# Patient Record
Sex: Female | Born: 1935 | Race: White | Hispanic: No | Marital: Single | Smoking: Former smoker
Health system: Southern US, Community
[De-identification: ages and names within clinical notes are randomized; demographics above are authoritative.]

## PROBLEM LIST (undated history)

## (undated) DIAGNOSIS — R159 Full incontinence of feces: Secondary | ICD-10-CM

## (undated) DIAGNOSIS — K219 Gastro-esophageal reflux disease without esophagitis: Secondary | ICD-10-CM

## (undated) DIAGNOSIS — N3941 Urge incontinence: Secondary | ICD-10-CM

## (undated) DIAGNOSIS — M1990 Unspecified osteoarthritis, unspecified site: Secondary | ICD-10-CM

## (undated) DIAGNOSIS — G629 Polyneuropathy, unspecified: Secondary | ICD-10-CM

## (undated) DIAGNOSIS — R42 Dizziness and giddiness: Secondary | ICD-10-CM

## (undated) HISTORY — DX: Polyneuropathy, unspecified: G62.9

## (undated) HISTORY — PX: TONSILLECTOMY: SUR1361

## (undated) HISTORY — DX: Full incontinence of feces: R15.9

## (undated) HISTORY — DX: Gastro-esophageal reflux disease without esophagitis: K21.9

## (undated) HISTORY — PX: BREAST LUMPECTOMY: SHX2

## (undated) HISTORY — DX: Unspecified osteoarthritis, unspecified site: M19.90

## (undated) HISTORY — DX: Urge incontinence: N39.41

## (undated) HISTORY — PX: FACIAL COSMETIC SURGERY: SHX629

## (undated) HISTORY — PX: NASAL SEPTUM SURGERY: SHX37

## (undated) HISTORY — DX: Dizziness and giddiness: R42

---

## 2014-01-18 HISTORY — PX: CATARACT EXTRACTION, BILATERAL: SHX1313

## 2015-01-19 HISTORY — PX: OTHER SURGICAL HISTORY: SHX169

## 2015-02-17 DIAGNOSIS — M810 Age-related osteoporosis without current pathological fracture: Secondary | ICD-10-CM | POA: Diagnosis not present

## 2015-02-17 DIAGNOSIS — F329 Major depressive disorder, single episode, unspecified: Secondary | ICD-10-CM | POA: Diagnosis not present

## 2015-03-06 DIAGNOSIS — Z1231 Encounter for screening mammogram for malignant neoplasm of breast: Secondary | ICD-10-CM | POA: Diagnosis not present

## 2015-03-18 DIAGNOSIS — K219 Gastro-esophageal reflux disease without esophagitis: Secondary | ICD-10-CM | POA: Diagnosis not present

## 2015-03-18 DIAGNOSIS — N39498 Other specified urinary incontinence: Secondary | ICD-10-CM | POA: Diagnosis not present

## 2015-03-18 DIAGNOSIS — M5417 Radiculopathy, lumbosacral region: Secondary | ICD-10-CM | POA: Diagnosis not present

## 2015-03-18 DIAGNOSIS — F329 Major depressive disorder, single episode, unspecified: Secondary | ICD-10-CM | POA: Diagnosis not present

## 2015-03-20 DIAGNOSIS — R2689 Other abnormalities of gait and mobility: Secondary | ICD-10-CM | POA: Diagnosis not present

## 2015-03-25 DIAGNOSIS — R2689 Other abnormalities of gait and mobility: Secondary | ICD-10-CM | POA: Diagnosis not present

## 2015-04-01 DIAGNOSIS — R2689 Other abnormalities of gait and mobility: Secondary | ICD-10-CM | POA: Diagnosis not present

## 2015-04-04 DIAGNOSIS — E7800 Pure hypercholesterolemia, unspecified: Secondary | ICD-10-CM | POA: Diagnosis not present

## 2015-04-04 DIAGNOSIS — D649 Anemia, unspecified: Secondary | ICD-10-CM | POA: Diagnosis not present

## 2015-04-04 DIAGNOSIS — F329 Major depressive disorder, single episode, unspecified: Secondary | ICD-10-CM | POA: Diagnosis not present

## 2015-04-04 DIAGNOSIS — M810 Age-related osteoporosis without current pathological fracture: Secondary | ICD-10-CM | POA: Diagnosis not present

## 2015-04-08 DIAGNOSIS — F329 Major depressive disorder, single episode, unspecified: Secondary | ICD-10-CM | POA: Diagnosis not present

## 2015-04-08 DIAGNOSIS — G64 Other disorders of peripheral nervous system: Secondary | ICD-10-CM | POA: Diagnosis not present

## 2015-04-08 DIAGNOSIS — N39498 Other specified urinary incontinence: Secondary | ICD-10-CM | POA: Diagnosis not present

## 2015-04-08 DIAGNOSIS — M5417 Radiculopathy, lumbosacral region: Secondary | ICD-10-CM | POA: Diagnosis not present

## 2015-04-09 DIAGNOSIS — R2689 Other abnormalities of gait and mobility: Secondary | ICD-10-CM | POA: Diagnosis not present

## 2015-04-14 DIAGNOSIS — R2689 Other abnormalities of gait and mobility: Secondary | ICD-10-CM | POA: Diagnosis not present

## 2015-04-23 DIAGNOSIS — R9431 Abnormal electrocardiogram [ECG] [EKG]: Secondary | ICD-10-CM | POA: Diagnosis not present

## 2015-04-23 DIAGNOSIS — W19XXXA Unspecified fall, initial encounter: Secondary | ICD-10-CM | POA: Diagnosis not present

## 2015-04-23 DIAGNOSIS — R079 Chest pain, unspecified: Secondary | ICD-10-CM | POA: Diagnosis not present

## 2015-04-23 DIAGNOSIS — R531 Weakness: Secondary | ICD-10-CM | POA: Diagnosis not present

## 2015-04-23 DIAGNOSIS — R51 Headache: Secondary | ICD-10-CM | POA: Diagnosis not present

## 2015-04-23 DIAGNOSIS — G319 Degenerative disease of nervous system, unspecified: Secondary | ICD-10-CM | POA: Diagnosis not present

## 2015-04-23 DIAGNOSIS — Z9181 History of falling: Secondary | ICD-10-CM | POA: Diagnosis not present

## 2015-04-25 DIAGNOSIS — R296 Repeated falls: Secondary | ICD-10-CM | POA: Diagnosis not present

## 2015-04-25 DIAGNOSIS — R269 Unspecified abnormalities of gait and mobility: Secondary | ICD-10-CM | POA: Diagnosis not present

## 2015-04-25 DIAGNOSIS — F329 Major depressive disorder, single episode, unspecified: Secondary | ICD-10-CM | POA: Diagnosis not present

## 2015-04-30 DIAGNOSIS — R269 Unspecified abnormalities of gait and mobility: Secondary | ICD-10-CM | POA: Diagnosis not present

## 2015-04-30 DIAGNOSIS — Z853 Personal history of malignant neoplasm of breast: Secondary | ICD-10-CM | POA: Diagnosis not present

## 2015-04-30 DIAGNOSIS — M5116 Intervertebral disc disorders with radiculopathy, lumbar region: Secondary | ICD-10-CM | POA: Diagnosis not present

## 2015-04-30 DIAGNOSIS — M4726 Other spondylosis with radiculopathy, lumbar region: Secondary | ICD-10-CM | POA: Diagnosis not present

## 2015-04-30 DIAGNOSIS — M50223 Other cervical disc displacement at C6-C7 level: Secondary | ICD-10-CM | POA: Diagnosis not present

## 2015-04-30 DIAGNOSIS — M50322 Other cervical disc degeneration at C5-C6 level: Secondary | ICD-10-CM | POA: Diagnosis not present

## 2015-04-30 DIAGNOSIS — M47812 Spondylosis without myelopathy or radiculopathy, cervical region: Secondary | ICD-10-CM | POA: Diagnosis not present

## 2015-04-30 DIAGNOSIS — M4806 Spinal stenosis, lumbar region: Secondary | ICD-10-CM | POA: Diagnosis not present

## 2015-04-30 DIAGNOSIS — M9971 Connective tissue and disc stenosis of intervertebral foramina of cervical region: Secondary | ICD-10-CM | POA: Diagnosis not present

## 2015-04-30 DIAGNOSIS — R9082 White matter disease, unspecified: Secondary | ICD-10-CM | POA: Diagnosis not present

## 2015-04-30 DIAGNOSIS — G319 Degenerative disease of nervous system, unspecified: Secondary | ICD-10-CM | POA: Diagnosis not present

## 2015-04-30 DIAGNOSIS — M4319 Spondylolisthesis, multiple sites in spine: Secondary | ICD-10-CM | POA: Diagnosis not present

## 2015-05-13 DIAGNOSIS — R05 Cough: Secondary | ICD-10-CM | POA: Diagnosis not present

## 2015-06-13 DIAGNOSIS — H31113 Age-related choroidal atrophy, bilateral: Secondary | ICD-10-CM | POA: Diagnosis not present

## 2015-06-13 DIAGNOSIS — F321 Major depressive disorder, single episode, moderate: Secondary | ICD-10-CM | POA: Diagnosis not present

## 2015-06-20 DIAGNOSIS — F321 Major depressive disorder, single episode, moderate: Secondary | ICD-10-CM | POA: Diagnosis not present

## 2015-06-24 DIAGNOSIS — F321 Major depressive disorder, single episode, moderate: Secondary | ICD-10-CM | POA: Diagnosis not present

## 2015-07-01 DIAGNOSIS — F321 Major depressive disorder, single episode, moderate: Secondary | ICD-10-CM | POA: Diagnosis not present

## 2015-07-08 DIAGNOSIS — F321 Major depressive disorder, single episode, moderate: Secondary | ICD-10-CM | POA: Diagnosis not present

## 2015-07-15 DIAGNOSIS — F321 Major depressive disorder, single episode, moderate: Secondary | ICD-10-CM | POA: Diagnosis not present

## 2015-07-25 DIAGNOSIS — F321 Major depressive disorder, single episode, moderate: Secondary | ICD-10-CM | POA: Diagnosis not present

## 2015-07-29 DIAGNOSIS — L820 Inflamed seborrheic keratosis: Secondary | ICD-10-CM | POA: Diagnosis not present

## 2015-07-29 DIAGNOSIS — F321 Major depressive disorder, single episode, moderate: Secondary | ICD-10-CM | POA: Diagnosis not present

## 2015-08-06 DIAGNOSIS — F321 Major depressive disorder, single episode, moderate: Secondary | ICD-10-CM | POA: Diagnosis not present

## 2015-08-12 DIAGNOSIS — F321 Major depressive disorder, single episode, moderate: Secondary | ICD-10-CM | POA: Diagnosis not present

## 2015-08-19 DIAGNOSIS — F321 Major depressive disorder, single episode, moderate: Secondary | ICD-10-CM | POA: Diagnosis not present

## 2015-08-26 DIAGNOSIS — F321 Major depressive disorder, single episode, moderate: Secondary | ICD-10-CM | POA: Diagnosis not present

## 2015-08-28 DIAGNOSIS — H26493 Other secondary cataract, bilateral: Secondary | ICD-10-CM | POA: Diagnosis not present

## 2015-08-28 DIAGNOSIS — H1132 Conjunctival hemorrhage, left eye: Secondary | ICD-10-CM | POA: Diagnosis not present

## 2015-09-02 DIAGNOSIS — F321 Major depressive disorder, single episode, moderate: Secondary | ICD-10-CM | POA: Diagnosis not present

## 2015-09-09 DIAGNOSIS — F321 Major depressive disorder, single episode, moderate: Secondary | ICD-10-CM | POA: Diagnosis not present

## 2015-09-16 DIAGNOSIS — F321 Major depressive disorder, single episode, moderate: Secondary | ICD-10-CM | POA: Diagnosis not present

## 2015-09-24 DIAGNOSIS — F321 Major depressive disorder, single episode, moderate: Secondary | ICD-10-CM | POA: Diagnosis not present

## 2015-10-01 DIAGNOSIS — F321 Major depressive disorder, single episode, moderate: Secondary | ICD-10-CM | POA: Diagnosis not present

## 2015-10-06 DIAGNOSIS — F321 Major depressive disorder, single episode, moderate: Secondary | ICD-10-CM | POA: Diagnosis not present

## 2015-10-06 DIAGNOSIS — Z23 Encounter for immunization: Secondary | ICD-10-CM | POA: Diagnosis not present

## 2015-10-15 DIAGNOSIS — F321 Major depressive disorder, single episode, moderate: Secondary | ICD-10-CM | POA: Diagnosis not present

## 2015-10-22 DIAGNOSIS — F321 Major depressive disorder, single episode, moderate: Secondary | ICD-10-CM | POA: Diagnosis not present

## 2015-10-29 DIAGNOSIS — F321 Major depressive disorder, single episode, moderate: Secondary | ICD-10-CM | POA: Diagnosis not present

## 2015-11-06 DIAGNOSIS — F321 Major depressive disorder, single episode, moderate: Secondary | ICD-10-CM | POA: Diagnosis not present

## 2015-11-19 DIAGNOSIS — F321 Major depressive disorder, single episode, moderate: Secondary | ICD-10-CM | POA: Diagnosis not present

## 2015-11-26 DIAGNOSIS — F321 Major depressive disorder, single episode, moderate: Secondary | ICD-10-CM | POA: Diagnosis not present

## 2015-12-02 DIAGNOSIS — H26491 Other secondary cataract, right eye: Secondary | ICD-10-CM | POA: Diagnosis not present

## 2015-12-03 DIAGNOSIS — F321 Major depressive disorder, single episode, moderate: Secondary | ICD-10-CM | POA: Diagnosis not present

## 2015-12-19 DIAGNOSIS — F321 Major depressive disorder, single episode, moderate: Secondary | ICD-10-CM | POA: Diagnosis not present

## 2015-12-24 DIAGNOSIS — H26491 Other secondary cataract, right eye: Secondary | ICD-10-CM | POA: Diagnosis not present

## 2016-01-20 ENCOUNTER — Ambulatory Visit (INDEPENDENT_AMBULATORY_CARE_PROVIDER_SITE_OTHER): Payer: Medicare Other | Admitting: Family Medicine

## 2016-01-20 VITALS — BP 167/75 | HR 85 | Temp 97.9°F | Resp 18 | Ht 66.0 in | Wt 152.0 lb

## 2016-01-20 DIAGNOSIS — T162XXA Foreign body in left ear, initial encounter: Secondary | ICD-10-CM | POA: Diagnosis not present

## 2016-01-20 DIAGNOSIS — H9202 Otalgia, left ear: Secondary | ICD-10-CM

## 2016-01-20 NOTE — Progress Notes (Signed)
Patient ID: Jodi Miller, female    DOB: Aug 10, 1934  Age: 82 y.o. MRN: [id number]  Chief Complaint  Patient presents with  . Foreign Body in [place]    believes her hearing aid is stuck in her left ear    Subjective:   82 year old lady who wears hearing aids. A portion of the left hearing aid came off in the ear canal a couple of days ago and will come out. It is causing a little discomfort.  Current allergies, medications, problem list, past/family and social histories reviewed.  Objective:  BP (!) 167/75   Pulse 85   Temp 97.9 F (36.6 C) (Oral)   Resp 18   Ht 5\' 6"  (1.676 m)   Wt 152 lb (68.9 kg)   SpO2 96%   BMI 24.53 kg/m   Hearing aids tip is present in the left ear canal  Procedure: Using alligator forceps under direct visualization the foreign object was grasped and extracted without any major difficulty. Patient tolerated well. She will discuss this further with her hearing aid doctor.  Assessment & Plan:   Assessment: 1. Foreign body of left ear, initial encounter   2. Otalgia of left ear       Plan: Removed foreign body  No orders of the defined types were placed in this encounter.    Meds ordered this encounter  Medications  . gabapentin (NEURONTIN) 300 MG capsule    Sig: Take 300 mg by mouth 3 (three) times daily.  Marland Kitchen venlafaxine (EFFEXOR) 37.5 MG tablet    Sig: Take 37.5 mg by mouth 2 (two) times daily.         Patient Instructions  Recommend getting advice from your hearing aid doctor regarding whether this is likely to happen again from this particular device.  Return if problems    Return if symptoms worsen or fail to improve.   Jermanie Minshall, MD 01/20/2016

## 2016-01-20 NOTE — Patient Instructions (Signed)
Recommend getting advice from your hearing aid doctor regarding whether this is likely to happen again from this particular device.  Return if problems

## 2016-01-23 DIAGNOSIS — F321 Major depressive disorder, single episode, moderate: Secondary | ICD-10-CM | POA: Diagnosis not present

## 2016-01-26 DIAGNOSIS — M8588 Other specified disorders of bone density and structure, other site: Secondary | ICD-10-CM | POA: Diagnosis not present

## 2016-01-26 DIAGNOSIS — E7800 Pure hypercholesterolemia, unspecified: Secondary | ICD-10-CM | POA: Diagnosis not present

## 2016-01-26 DIAGNOSIS — K210 Gastro-esophageal reflux disease with esophagitis: Secondary | ICD-10-CM | POA: Diagnosis not present

## 2016-01-29 DIAGNOSIS — N3941 Urge incontinence: Secondary | ICD-10-CM | POA: Diagnosis not present

## 2016-01-29 DIAGNOSIS — E7800 Pure hypercholesterolemia, unspecified: Secondary | ICD-10-CM | POA: Diagnosis not present

## 2016-01-29 DIAGNOSIS — K219 Gastro-esophageal reflux disease without esophagitis: Secondary | ICD-10-CM | POA: Diagnosis not present

## 2016-01-29 DIAGNOSIS — E559 Vitamin D deficiency, unspecified: Secondary | ICD-10-CM | POA: Diagnosis not present

## 2016-01-29 DIAGNOSIS — F329 Major depressive disorder, single episode, unspecified: Secondary | ICD-10-CM | POA: Diagnosis not present

## 2016-01-30 DIAGNOSIS — M5137 Other intervertebral disc degeneration, lumbosacral region: Secondary | ICD-10-CM | POA: Diagnosis not present

## 2016-01-30 DIAGNOSIS — S338XXA Sprain of other parts of lumbar spine and pelvis, initial encounter: Secondary | ICD-10-CM | POA: Diagnosis not present

## 2016-01-30 DIAGNOSIS — M5417 Radiculopathy, lumbosacral region: Secondary | ICD-10-CM | POA: Diagnosis not present

## 2016-01-30 DIAGNOSIS — M9903 Segmental and somatic dysfunction of lumbar region: Secondary | ICD-10-CM | POA: Diagnosis not present

## 2016-02-02 DIAGNOSIS — M9903 Segmental and somatic dysfunction of lumbar region: Secondary | ICD-10-CM | POA: Diagnosis not present

## 2016-02-02 DIAGNOSIS — M5137 Other intervertebral disc degeneration, lumbosacral region: Secondary | ICD-10-CM | POA: Diagnosis not present

## 2016-02-02 DIAGNOSIS — M5417 Radiculopathy, lumbosacral region: Secondary | ICD-10-CM | POA: Diagnosis not present

## 2016-02-02 DIAGNOSIS — S338XXA Sprain of other parts of lumbar spine and pelvis, initial encounter: Secondary | ICD-10-CM | POA: Diagnosis not present

## 2016-02-27 DIAGNOSIS — E559 Vitamin D deficiency, unspecified: Secondary | ICD-10-CM | POA: Diagnosis not present

## 2016-02-27 DIAGNOSIS — G629 Polyneuropathy, unspecified: Secondary | ICD-10-CM | POA: Diagnosis not present

## 2016-02-27 DIAGNOSIS — R030 Elevated blood-pressure reading, without diagnosis of hypertension: Secondary | ICD-10-CM | POA: Diagnosis not present

## 2016-02-27 DIAGNOSIS — Z853 Personal history of malignant neoplasm of breast: Secondary | ICD-10-CM | POA: Diagnosis not present

## 2016-02-27 DIAGNOSIS — Z7689 Persons encountering health services in other specified circumstances: Secondary | ICD-10-CM | POA: Diagnosis not present

## 2016-02-27 DIAGNOSIS — Z1231 Encounter for screening mammogram for malignant neoplasm of breast: Secondary | ICD-10-CM | POA: Diagnosis not present

## 2016-02-27 DIAGNOSIS — R195 Other fecal abnormalities: Secondary | ICD-10-CM | POA: Diagnosis not present

## 2016-03-08 DIAGNOSIS — Z853 Personal history of malignant neoplasm of breast: Secondary | ICD-10-CM | POA: Diagnosis not present

## 2016-03-08 DIAGNOSIS — Z1231 Encounter for screening mammogram for malignant neoplasm of breast: Secondary | ICD-10-CM | POA: Diagnosis not present

## 2016-04-27 DIAGNOSIS — R197 Diarrhea, unspecified: Secondary | ICD-10-CM | POA: Diagnosis not present

## 2016-05-03 DIAGNOSIS — Z853 Personal history of malignant neoplasm of breast: Secondary | ICD-10-CM | POA: Diagnosis not present

## 2016-05-03 DIAGNOSIS — Z0000 Encounter for general adult medical examination without abnormal findings: Secondary | ICD-10-CM | POA: Diagnosis not present

## 2016-05-03 DIAGNOSIS — G629 Polyneuropathy, unspecified: Secondary | ICD-10-CM | POA: Diagnosis not present

## 2016-05-03 DIAGNOSIS — E559 Vitamin D deficiency, unspecified: Secondary | ICD-10-CM | POA: Diagnosis not present

## 2016-05-03 DIAGNOSIS — R030 Elevated blood-pressure reading, without diagnosis of hypertension: Secondary | ICD-10-CM | POA: Diagnosis not present

## 2016-05-03 DIAGNOSIS — F4323 Adjustment disorder with mixed anxiety and depressed mood: Secondary | ICD-10-CM | POA: Diagnosis not present

## 2016-05-03 DIAGNOSIS — Z23 Encounter for immunization: Secondary | ICD-10-CM | POA: Diagnosis not present

## 2016-05-06 DIAGNOSIS — R197 Diarrhea, unspecified: Secondary | ICD-10-CM | POA: Diagnosis not present

## 2016-05-10 DIAGNOSIS — F1011 Alcohol abuse, in remission: Secondary | ICD-10-CM | POA: Diagnosis not present

## 2016-05-10 DIAGNOSIS — G629 Polyneuropathy, unspecified: Secondary | ICD-10-CM | POA: Diagnosis not present

## 2016-05-10 DIAGNOSIS — E559 Vitamin D deficiency, unspecified: Secondary | ICD-10-CM | POA: Diagnosis not present

## 2016-05-17 DIAGNOSIS — R197 Diarrhea, unspecified: Secondary | ICD-10-CM | POA: Diagnosis not present

## 2016-06-16 DIAGNOSIS — K529 Noninfective gastroenteritis and colitis, unspecified: Secondary | ICD-10-CM | POA: Diagnosis not present

## 2016-06-16 DIAGNOSIS — K5730 Diverticulosis of large intestine without perforation or abscess without bleeding: Secondary | ICD-10-CM | POA: Diagnosis not present

## 2016-06-16 DIAGNOSIS — K5289 Other specified noninfective gastroenteritis and colitis: Secondary | ICD-10-CM | POA: Diagnosis not present

## 2016-06-22 DIAGNOSIS — K5289 Other specified noninfective gastroenteritis and colitis: Secondary | ICD-10-CM | POA: Diagnosis not present

## 2016-07-06 DIAGNOSIS — F4323 Adjustment disorder with mixed anxiety and depressed mood: Secondary | ICD-10-CM | POA: Diagnosis not present

## 2016-07-06 DIAGNOSIS — E559 Vitamin D deficiency, unspecified: Secondary | ICD-10-CM | POA: Diagnosis not present

## 2016-07-06 DIAGNOSIS — G629 Polyneuropathy, unspecified: Secondary | ICD-10-CM | POA: Diagnosis not present

## 2016-07-06 DIAGNOSIS — Z853 Personal history of malignant neoplasm of breast: Secondary | ICD-10-CM | POA: Diagnosis not present

## 2016-07-06 DIAGNOSIS — R252 Cramp and spasm: Secondary | ICD-10-CM | POA: Diagnosis not present

## 2016-07-06 DIAGNOSIS — R197 Diarrhea, unspecified: Secondary | ICD-10-CM | POA: Diagnosis not present

## 2016-07-06 DIAGNOSIS — R030 Elevated blood-pressure reading, without diagnosis of hypertension: Secondary | ICD-10-CM | POA: Diagnosis not present

## 2016-07-27 DIAGNOSIS — K52831 Collagenous colitis: Secondary | ICD-10-CM | POA: Diagnosis not present

## 2016-07-28 DIAGNOSIS — D225 Melanocytic nevi of trunk: Secondary | ICD-10-CM | POA: Diagnosis not present

## 2016-07-28 DIAGNOSIS — L820 Inflamed seborrheic keratosis: Secondary | ICD-10-CM | POA: Diagnosis not present

## 2016-07-28 DIAGNOSIS — L821 Other seborrheic keratosis: Secondary | ICD-10-CM | POA: Diagnosis not present

## 2016-09-16 DIAGNOSIS — E559 Vitamin D deficiency, unspecified: Secondary | ICD-10-CM | POA: Diagnosis not present

## 2016-09-16 DIAGNOSIS — G629 Polyneuropathy, unspecified: Secondary | ICD-10-CM | POA: Diagnosis not present

## 2016-09-16 DIAGNOSIS — L299 Pruritus, unspecified: Secondary | ICD-10-CM | POA: Diagnosis not present

## 2016-09-16 DIAGNOSIS — R197 Diarrhea, unspecified: Secondary | ICD-10-CM | POA: Diagnosis not present

## 2016-09-16 DIAGNOSIS — R030 Elevated blood-pressure reading, without diagnosis of hypertension: Secondary | ICD-10-CM | POA: Diagnosis not present

## 2016-11-09 ENCOUNTER — Encounter: Payer: Self-pay | Admitting: *Deleted

## 2016-11-10 ENCOUNTER — Encounter: Payer: Self-pay | Admitting: Diagnostic Neuroimaging

## 2016-11-10 ENCOUNTER — Ambulatory Visit (INDEPENDENT_AMBULATORY_CARE_PROVIDER_SITE_OTHER): Payer: Medicare Other | Admitting: Diagnostic Neuroimaging

## 2016-11-10 VITALS — BP 153/87 | HR 72 | Ht 66.0 in | Wt 161.2 lb

## 2016-11-10 DIAGNOSIS — G629 Polyneuropathy, unspecified: Secondary | ICD-10-CM

## 2016-11-10 DIAGNOSIS — M5136 Other intervertebral disc degeneration, lumbar region: Secondary | ICD-10-CM

## 2016-11-10 NOTE — Progress Notes (Signed)
[facility]  PATIENT: Jodi Miller DOB: 23-Jul-1934  REFERRING CLINICIAN: Merita Norton HISTORY FROM: patient  REASON FOR VISIT: new consult    HISTORICAL  CHIEF COMPLAINT:  Chief Complaint  Patient presents with  . New Patient (Initial Visit)    HISTORY OF PRESENT ILLNESS:   82 year old female here for evaluation of neuropathy.  Patient previously lived in [place].  In May 01, 2012 she developed onset of numbness and tingling in her toes, feet, legs.  This gradually worsened over time.  She developed muscle cramps in her bilateral calves.  Symptoms worse in the morning, slightly better later in the day and with activity.  Her balance has been slightly off.  She saw a neurologist in [place], had extensive testing including nerve conduction study, MRI of lumbar spine, lab testing.  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  Patient went up to 400 mg twice a day but had some side effects, and now is on gabapentin 200 mg twice a day.  She feels like symptoms are slightly better with the medication but she is not sure.  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  In 2014-2016 patient had significant increase in alcohol use, around the same time of significantly increased stress related to her late husband's ailing health.  He passed away in 05-02-2014 patient's drinking continued excessively for the next year.  Ultimately she checked herself into detox program and rehabilitation in May 02, 2015.  She has been abstinent from alcohol for the past 1-1/2 years.  Patient denies any burning, pins and needles, tingling in her toes or feet.  She has some low back pain issues.  She had epidural steroid injections in the past.  Patient has stayed fairly active, including exercise classes at the gym, yoga and other activities.  Patient enjoys dance as well.  Patient does have some muscle cramps and restlessness, late in  the evening, especially when she is trying to sleep.  She typically uses packets of mustard which helps relieve her cramps.   REVIEW OF SYSTEMS: Full 14 system review of systems performed and negative with exception of: Numbness dizziness depression too much sleep restless legs incontinence weight gain.  ALLERGIES: Allergies  Allergen Reactions  . Sulfa Antibiotics Rash    HOME MEDICATIONS: Outpatient Medications Prior to Visit  Medication Sig Dispense Refill  . venlafaxine (EFFEXOR) 37.5 MG tablet Take 37.5 mg by mouth 2 (two) times daily.    Marland Kitchen gabapentin (NEURONTIN) 300 MG capsule Take 300 mg by mouth 3 (three) times daily.     No facility-administered medications prior to visit.     PAST MEDICAL HISTORY: Past Medical History:  Diagnosis Date  . Arthritis   . GERD (gastroesophageal reflux disease)   . Peripheral neuropathy   . Stool incontinence   . Urge incontinence   . Vertigo     PAST SURGICAL HISTORY: Past Surgical History:  Procedure Laterality Date  . bletharoplaasty  05-02-2015  . BREAST LUMPECTOMY Right   . CATARACT EXTRACTION, BILATERAL  May 02, 2014  . FACIAL COSMETIC SURGERY    . NASAL SEPTUM SURGERY    . TONSILLECTOMY      FAMILY HISTORY: Family History  Problem Relation Age of Onset  . Heart disease Mother   . Dementia Mother   . Heart failure Father   . Lupus Father   . Emphysema Father     SOCIAL HISTORY:  Social History   Social History  .  Marital status: Single    Spouse name: N/A  . Number of children: N/A  . Years of education: N/A   Occupational History  . Not on file.   Social History Main Topics  . Smoking status: Former Smoker    Packs/day: 1.00    Types: Cigarettes    Quit date: 11/09/1996  . Smokeless tobacco: Never Used  . Alcohol use No  . Drug use: No  . Sexual activity: Not on file   Other Topics Concern  . Not on file   Social History Narrative  . No narrative on file     PHYSICAL EXAM  GENERAL  EXAM/CONSTITUTIONAL: Vitals:  Vitals:   11/10/16 0856  BP: (!) 153/87  Pulse: 72  Weight: 161 lb 3.2 oz (73.1 kg)  Height: 5\' 6"  (1.676 m)     Body mass index is 26.02 kg/m.  No exam data present  Patient is in no distress; well developed, nourished and groomed; neck is supple  CARDIOVASCULAR:  Examination of carotid arteries is normal; no carotid bruits  Regular rate and rhythm, no murmurs  Examination of peripheral vascular system by observation and palpation is normal  EYES:  Ophthalmoscopic exam of optic discs and posterior segments is normal; no papilledema or hemorrhages  MUSCULOSKELETAL:  Gait, strength, tone, movements noted in Neurologic exam below  NEUROLOGIC: MENTAL STATUS:  No flowsheet data found.  awake, alert, oriented to person, place and time  recent and remote memory intact  normal attention and concentration  language fluent, comprehension intact, naming intact,   fund of knowledge appropriate  CRANIAL NERVE:   2nd - no papilledema on fundoscopic exam  2nd, 3rd, 4th, 6th - pupils equal and reactive to light, visual fields full to confrontation, extraocular muscles intact, no nystagmus  5th - facial sensation symmetric  7th - facial strength symmetric  8th - hearing intact  9th - palate elevates symmetrically, uvula midline  11th - shoulder shrug symmetric  12th - tongue protrusion midline  MOTOR:   normal bulk and tone, full strength in the BUE, BLE  SENSORY:   normal and symmetric to light touch, temperature  DECR PP IN FINGERS AND LOWER EXT  DECR VIB AT TOES  COORDINATION:   finger-nose-finger, fine finger movements normal  REFLEXES:   deep tendon reflexes --> BUE 1; KNEES TRACE; ANKLES 0  GAIT/STATION:   narrow based gait; CAUTION WITH TANDEM; romberg is negative    DIAGNOSTIC DATA (LABS, IMAGING, TESTING) - I reviewed patient records, labs, notes, testing and imaging myself where available.  No  results found for: WBC, HGB, HCT, MCV, PLT No results found for: NA, K, CL, CO2, GLUCOSE, BUN, CREATININE, CALCIUM, PROT, ALBUMIN, AST, ALT, ALKPHOS, BILITOT, GFRNONAA, GFRAA No results found for: CHOL, HDL, LDLCALC, LDLDIRECT, TRIG, CHOLHDL No results found for: HGBA1C No results found for: VITAMINB12 No results found for: TSH  B12, TSH, glucose - normal   ASSESSMENT AND PLAN  82 y.o. year old female here with history of numbness and tingling in toes and feet, muscle cramps in calves, since 2014.  This was in the setting of excessive alcohol use.  Symptoms have stabilized since stopping alcohol.  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  She may also have some component of lumbar degenerative spine disease.  Gabapentin was slightly helping her symptoms but patient is not sure how effective this is.  We will taper her off and see if her symptoms necessitate treatment.  In future we could  go back to gabapentin, or consider Lyrica or Cymbalta.   Dx:  1. Neuropathy   2. Lumbar degenerative disc disease      PLAN: - may try tapering off gabapentin (reduce 1-2 tabs per week) to see if still effective or not - consider checking iron panel with PCP (may be related to RLS)  Return in about 4 months (around 03/13/2017).    Penni Bombard, MD 95/97/4718, 5:50 AM Certified in Neurology, Neurophysiology and Neuroimaging  [facility] [address] [place], [place] [address] [phone]

## 2016-11-10 NOTE — Patient Instructions (Signed)
-   may try tapering off gabapentin (reduce by 1-2 caps per week) to see if still effective or not  - consider checking iron panel with PCP

## 2016-12-07 DIAGNOSIS — K52831 Collagenous colitis: Secondary | ICD-10-CM | POA: Diagnosis not present

## 2017-01-25 DIAGNOSIS — H1132 Conjunctival hemorrhage, left eye: Secondary | ICD-10-CM | POA: Diagnosis not present

## 2017-03-09 DIAGNOSIS — Z853 Personal history of malignant neoplasm of breast: Secondary | ICD-10-CM | POA: Diagnosis not present

## 2017-03-09 DIAGNOSIS — Z1231 Encounter for screening mammogram for malignant neoplasm of breast: Secondary | ICD-10-CM | POA: Diagnosis not present

## 2017-03-15 ENCOUNTER — Telehealth: Payer: Self-pay | Admitting: *Deleted

## 2017-03-15 ENCOUNTER — Ambulatory Visit: Payer: Medicare Other | Admitting: Diagnostic Neuroimaging

## 2017-03-15 NOTE — Telephone Encounter (Signed)
Patient was no show for follow up today. 

## 2017-03-16 ENCOUNTER — Encounter: Payer: Self-pay | Admitting: Diagnostic Neuroimaging

## 2017-03-21 DIAGNOSIS — R030 Elevated blood-pressure reading, without diagnosis of hypertension: Secondary | ICD-10-CM | POA: Diagnosis not present

## 2017-03-21 DIAGNOSIS — G629 Polyneuropathy, unspecified: Secondary | ICD-10-CM | POA: Diagnosis not present

## 2017-03-21 DIAGNOSIS — R7309 Other abnormal glucose: Secondary | ICD-10-CM | POA: Diagnosis not present

## 2017-03-21 DIAGNOSIS — F4323 Adjustment disorder with mixed anxiety and depressed mood: Secondary | ICD-10-CM | POA: Diagnosis not present

## 2017-03-21 DIAGNOSIS — E559 Vitamin D deficiency, unspecified: Secondary | ICD-10-CM | POA: Diagnosis not present

## 2017-03-21 DIAGNOSIS — L989 Disorder of the skin and subcutaneous tissue, unspecified: Secondary | ICD-10-CM | POA: Diagnosis not present

## 2017-03-21 DIAGNOSIS — K52831 Collagenous colitis: Secondary | ICD-10-CM | POA: Diagnosis not present

## 2017-03-21 DIAGNOSIS — F5101 Primary insomnia: Secondary | ICD-10-CM | POA: Diagnosis not present

## 2017-03-21 DIAGNOSIS — Z853 Personal history of malignant neoplasm of breast: Secondary | ICD-10-CM | POA: Diagnosis not present

## 2017-03-21 DIAGNOSIS — G4762 Sleep related leg cramps: Secondary | ICD-10-CM | POA: Diagnosis not present

## 2017-03-21 DIAGNOSIS — R7301 Impaired fasting glucose: Secondary | ICD-10-CM | POA: Diagnosis not present

## 2017-04-12 DIAGNOSIS — L570 Actinic keratosis: Secondary | ICD-10-CM | POA: Diagnosis not present

## 2017-04-12 DIAGNOSIS — L821 Other seborrheic keratosis: Secondary | ICD-10-CM | POA: Diagnosis not present

## 2017-04-12 DIAGNOSIS — Z23 Encounter for immunization: Secondary | ICD-10-CM | POA: Diagnosis not present

## 2017-04-12 DIAGNOSIS — L853 Xerosis cutis: Secondary | ICD-10-CM | POA: Diagnosis not present

## 2017-04-29 ENCOUNTER — Ambulatory Visit (INDEPENDENT_AMBULATORY_CARE_PROVIDER_SITE_OTHER): Payer: Medicare Other

## 2017-04-29 ENCOUNTER — Ambulatory Visit (HOSPITAL_COMMUNITY)
Admission: EM | Admit: 2017-04-29 | Discharge: 2017-04-29 | Disposition: A | Discharge status: Home / Self Care | Payer: Medicare Other | Attending: Physician Assistant | Admitting: Physician Assistant

## 2017-04-29 ENCOUNTER — Encounter (HOSPITAL_COMMUNITY): Payer: Self-pay | Admitting: Family Medicine

## 2017-04-29 DIAGNOSIS — R0781 Pleurodynia: Secondary | ICD-10-CM

## 2017-04-29 DIAGNOSIS — W19XXXA Unspecified fall, initial encounter: Secondary | ICD-10-CM

## 2017-04-29 DIAGNOSIS — S299XXA Unspecified injury of thorax, initial encounter: Secondary | ICD-10-CM | POA: Diagnosis not present

## 2017-04-29 DIAGNOSIS — R0789 Other chest pain: Secondary | ICD-10-CM | POA: Diagnosis not present

## 2017-04-29 NOTE — ED Triage Notes (Addendum)
Pt here for fall this evening and she is having pain in the left rib/chest area radiating to right rib area. She fell face first after tripping on some  molding. No facial injury. sts she felt as if she bounced off the floor.

## 2017-04-29 NOTE — ED Provider Notes (Signed)
[place]    CSN: [id number] Arrival date & time: 04/29/17  1855     History   Chief Complaint Chief Complaint  Patient presents with  . Fall    HPI Jodi Miller is a 83 y.o. female.   83 year old female comes in with family member after fall.  States she tripped over some molding, and fell forward.  States she fell on her forearms, and hit her chest/rib area.  Denies head injury, loss of consciousness.  States stayed on the floor for a while, but was able to ambulate afterwards to call for family.  Denies trouble breathing, shortness of breath.  Took ibuprofen with unknown relief.  No obvious bruising.      Past Medical History:  Diagnosis Date  . Arthritis   . GERD (gastroesophageal reflux disease)   . Peripheral neuropathy   . Stool incontinence   . Urge incontinence   . Vertigo     There are no active problems to display for this patient.   Past Surgical History:  Procedure Laterality Date  . bletharoplaasty  2017  . BREAST LUMPECTOMY Right   . CATARACT EXTRACTION, BILATERAL  2016  . FACIAL COSMETIC SURGERY    . NASAL SEPTUM SURGERY    . TONSILLECTOMY      OB History   None      Home Medications    Prior to Admission medications   Medication Sig Start Date End Date Taking? Authorizing Provider  budesonide (ENTOCORT EC) 3 MG 24 hr capsule Take 3 mg by mouth daily.    [provider]  Cholecalciferol (VITAMIN D) 2000 units tablet Take 2,000 Units by mouth daily.    [provider]  gabapentin (NEURONTIN) 100 MG capsule Take 2 capsules by mouth 2 (two) times daily before a meal. 09/22/16   [provider]  lansoprazole (PREVACID) 30 MG capsule Take 1 capsule by mouth daily. 02/04/16   [provider]  meloxicam (MOBIC) 7.5 MG tablet Take 1 tablet by mouth 2 (two) times daily. 10/01/16   [provider]  mirabegron ER (MYRBETRIQ) 25 MG TB24 tablet Take 1 tablet by mouth daily. 02/04/16   [provider]  mirtazapine (REMERON) 7.5 MG tablet Take 1 tablet by mouth at bedtime. 02/04/16   [provider]  Omega-3 Fatty Acids (FISH OIL) 1000 MG CAPS Take 1 capsule by mouth daily.    [provider]  venlafaxine (EFFEXOR) 37.5 MG tablet Take 37.5 mg by mouth 2 (two) times daily.    [provider]    Family History Family History  Problem Relation Age of Onset  . Heart disease Mother   . Dementia Mother   . Heart failure Father   . Lupus Father   . Emphysema Father     Social History Social History   Tobacco Use  . Smoking status: Former Smoker    Packs/day: 1.00    Types: Cigarettes    Last attempt to quit: 11/09/1996    Years since quitting: 20.4  . Smokeless tobacco: Never Used  Substance Use Topics  . Alcohol use: No  . Drug use: No     Allergies   Sulfa antibiotics   Review of Systems Review of Systems  Reason unable to perform ROS: See HPI as above.     Physical Exam Triage Vital Signs ED Triage Vitals [04/29/17 1946]  Enc Vitals Group     BP      Pulse  Resp      Temp      Temp src      SpO2      Weight      Height      Head Circumference      Peak Flow      Pain Score 3     Pain Loc      Pain Edu?      Excl. in [place]?    No data found.  Updated Vital Signs BP 128/74   Pulse 70   Temp 98.1 F (36.7 C)   Resp 18   SpO2 100%   Physical Exam  Constitutional: She is oriented to person, place, and time. She appears well-developed and well-nourished. No distress.  HENT:  Head: Normocephalic and atraumatic.  Eyes: Pupils are equal, round, and reactive to light. Conjunctivae, EOM and lids are normal.  Neck: Normal range of motion. Neck supple.  Cardiovascular: Normal rate, regular rhythm and normal heart sounds. Exam reveals no gallop and no friction rub.  No murmur heard. Pulmonary/Chest: Effort normal and breath sounds normal. No stridor. No respiratory distress. She has no wheezes. She has no rales.    No swelling, erythema, increased warmth, contusion seen. Tenderness to palpation of inferior sternum, bilateral lower ribs.  Neurological: She is alert and oriented to person, place, and time. She is not disoriented.  Skin: She is not diaphoretic.    UC Treatments / Results  Labs (all labs ordered are listed, but only abnormal results are displayed) Labs Reviewed - No data to display  EKG None Radiology Dg Ribs Bilateral W/chest  Result Date: 04/29/2017 CLINICAL DATA:  Bilateral rib pain after fall. EXAM: BILATERAL RIBS AND CHEST - 4+ VIEW COMPARISON:  None. FINDINGS: No acute fracture involving the ribs. Old healed left fifth through ninth anterolateral rib fractures. Mild cardiomegaly. Normal mediastinal contours. Atherosclerotic calcification of the aortic arch. Normal pulmonary vascularity. No focal consolidation, pleural effusion, or pneumothorax. IMPRESSION: 1. No acute rib fracture.  Old healed left-sided rib fractures. 2. Mild cardiomegaly.  No active cardiopulmonary disease. Electronically Signed   By: Titus Dubin M.D.   On: 04/29/2017 20:43    Procedures Procedures (including critical care time)  Medications Ordered in UC Medications - No data to display   Initial Impression / Assessment and Plan / UC Course  I have reviewed the triage vital signs and the nursing notes.  Pertinent labs & imaging results that were available during my care of the patient were reviewed by me and considered in my medical decision making (see chart for details).    Discussed xray results with patient. Will have patient continue Mobic, ice compress. Monitor closely. Strict return precautions given. Patient and daughter expresses understanding and agrees to plan.   Final Clinical Impressions(s) / UC Diagnoses   Final diagnoses:  Fall    ED Discharge Orders    None        Arturo Morton 05/01/17 1202

## 2017-04-29 NOTE — Discharge Instructions (Signed)
Xray negative for new fractures. Does show old rib fractures. Monitor symptoms. If experiencing worsening symptoms, chest pain, shortness of breath, go to the emergency department for further evaluation. If experiencing worsening of symptoms, headache/blurry vision, nausea/vomiting, confusion/altered mental status, dizziness, weakness, passing out, imbalance, go to the emergency department for further evaluation.

## 2017-05-20 ENCOUNTER — Emergency Department (HOSPITAL_COMMUNITY): Payer: Medicare Other

## 2017-05-20 ENCOUNTER — Emergency Department (HOSPITAL_COMMUNITY)
Admission: EM | Admit: 2017-05-20 | Discharge: 2017-05-20 | Disposition: A | Discharge status: Home / Self Care | Payer: Medicare Other | Attending: Emergency Medicine | Admitting: Emergency Medicine

## 2017-05-20 ENCOUNTER — Encounter (HOSPITAL_COMMUNITY): Payer: Self-pay | Admitting: Emergency Medicine

## 2017-05-20 DIAGNOSIS — M25512 Pain in left shoulder: Secondary | ICD-10-CM | POA: Diagnosis not present

## 2017-05-20 DIAGNOSIS — Y929 Unspecified place or not applicable: Secondary | ICD-10-CM | POA: Insufficient documentation

## 2017-05-20 DIAGNOSIS — Y999 Unspecified external cause status: Secondary | ICD-10-CM | POA: Diagnosis not present

## 2017-05-20 DIAGNOSIS — Y939 Activity, unspecified: Secondary | ICD-10-CM | POA: Diagnosis not present

## 2017-05-20 DIAGNOSIS — S42202A Unspecified fracture of upper end of left humerus, initial encounter for closed fracture: Secondary | ICD-10-CM | POA: Diagnosis not present

## 2017-05-20 DIAGNOSIS — W0110XA Fall on same level from slipping, tripping and stumbling with subsequent striking against unspecified object, initial encounter: Secondary | ICD-10-CM | POA: Insufficient documentation

## 2017-05-20 DIAGNOSIS — T148XXA Other injury of unspecified body region, initial encounter: Secondary | ICD-10-CM | POA: Diagnosis not present

## 2017-05-20 DIAGNOSIS — Z87891 Personal history of nicotine dependence: Secondary | ICD-10-CM | POA: Insufficient documentation

## 2017-05-20 DIAGNOSIS — S4992XA Unspecified injury of left shoulder and upper arm, initial encounter: Secondary | ICD-10-CM | POA: Diagnosis present

## 2017-05-20 DIAGNOSIS — W19XXXA Unspecified fall, initial encounter: Secondary | ICD-10-CM

## 2017-05-20 DIAGNOSIS — S42302A Unspecified fracture of shaft of humerus, left arm, initial encounter for closed fracture: Secondary | ICD-10-CM | POA: Diagnosis not present

## 2017-05-20 DIAGNOSIS — Z79899 Other long term (current) drug therapy: Secondary | ICD-10-CM | POA: Diagnosis not present

## 2017-05-20 MED ORDER — DOCUSATE SODIUM 100 MG PO CAPS: 100.0000 mg | ORAL_CAPSULE | Freq: Two times a day (BID) | ORAL | 0 refills | Status: DC

## 2017-05-20 MED ORDER — OXYCODONE-ACETAMINOPHEN 5-325 MG PO TABS: 1.0000 | ORAL_TABLET | Freq: Four times a day (QID) | ORAL | 0 refills | Status: DC | PRN

## 2017-05-20 MED ORDER — OXYCODONE-ACETAMINOPHEN 5-325 MG PO TABS
1.0000 | ORAL_TABLET | Freq: Once | ORAL | Status: AC
  Administered 2017-05-20: 1 via ORAL
  Filled 2017-05-20: qty 1

## 2017-05-20 NOTE — ED Triage Notes (Signed)
Patient here from home via EMS with complaints of left shoulder pain from fall. Pain 10/10, 168mcg Fent given.

## 2017-05-20 NOTE — ED Provider Notes (Signed)
[place] DEPT Provider Note   CSN: [id number] Arrival date & time: 05/20/17  1358     History   Chief Complaint Chief Complaint  Patient presents with  . Shoulder Pain  . Fall    HPI Jodi Miller is a 83 y.o. female.  HPI Patient reports that she tripped over her dog.  She landed on her left shoulder.  All pain is in the left shoulder.  Any movement is very painful.  She denies any chest pain, neck pain or headache.  She denies she hit her head or had loss of consciousness.  No abdominal pain or lower extremity pain.  She fell last week and hit the right side of her chest.  Reports she did not have any fractures at that time. Past Medical History:  Diagnosis Date  . Arthritis   . GERD (gastroesophageal reflux disease)   . Peripheral neuropathy   . Stool incontinence   . Urge incontinence   . Vertigo     There are no active problems to display for this patient.   Past Surgical History:  Procedure Laterality Date  . bletharoplaasty  2017  . BREAST LUMPECTOMY Right   . CATARACT EXTRACTION, BILATERAL  2016  . FACIAL COSMETIC SURGERY    . NASAL SEPTUM SURGERY    . TONSILLECTOMY       OB History   None      Home Medications    Prior to Admission medications   Medication Sig Start Date End Date Taking? Authorizing Provider  budesonide (ENTOCORT EC) 3 MG 24 hr capsule Take 3 mg by mouth daily.    [provider]  Cholecalciferol (VITAMIN D) 2000 units tablet Take 2,000 Units by mouth daily.    [provider]  gabapentin (NEURONTIN) 100 MG capsule Take 2 capsules by mouth 2 (two) times daily before a meal. 09/22/16   [provider]  lansoprazole (PREVACID) 30 MG capsule Take 1 capsule by mouth daily. 02/04/16   [provider]  meloxicam (MOBIC) 7.5 MG tablet Take 1 tablet by mouth 2 (two) times daily. 10/01/16   [provider]  mirabegron ER (MYRBETRIQ) 25 MG TB24 tablet Take 1 tablet by  mouth daily. 02/04/16   [provider]  mirtazapine (REMERON) 7.5 MG tablet Take 1 tablet by mouth at bedtime. 02/04/16   [provider]  Omega-3 Fatty Acids (FISH OIL) 1000 MG CAPS Take 1 capsule by mouth daily.    [provider]  venlafaxine (EFFEXOR) 37.5 MG tablet Take 37.5 mg by mouth 2 (two) times daily.    [provider]    Family History Family History  Problem Relation Age of Onset  . Heart disease Mother   . Dementia Mother   . Heart failure Father   . Lupus Father   . Emphysema Father     Social History Social History   Tobacco Use  . Smoking status: Former Smoker    Packs/day: 1.00    Types: Cigarettes    Last attempt to quit: 11/09/1996    Years since quitting: 20.5  . Smokeless tobacco: Never Used  Substance Use Topics  . Alcohol use: No  . Drug use: No     Allergies   Sulfa antibiotics   Review of Systems Review of Systems 10 Systems reviewed and are negative for acute change except as noted in the HPI.   Physical Exam Updated Vital Signs BP 136/82 (BP Location: Right Arm)   Pulse  78   Temp 98.6 F (37 C) (Oral)   Resp 16   SpO2 100%   Physical Exam  Constitutional: She is oriented to person, place, and time. She appears well-developed and well-nourished.  HENT:  Head: Normocephalic and atraumatic.  Right Ear: External ear normal.  Left Ear: External ear normal.  Mouth/Throat: Oropharynx is clear and moist.  Eyes: EOM are normal.  Neck: Neck supple.  No C-spine tenderness.  Cardiovascular: Normal rate, regular rhythm, normal heart sounds and intact distal pulses.  Pulmonary/Chest: Effort normal and breath sounds normal. She exhibits no tenderness.  Abdominal: Soft. Bowel sounds are normal. She exhibits no distension. There is no tenderness.  Musculoskeletal: Normal range of motion. She exhibits tenderness and deformity. She exhibits no edema.  Pain at left shoulder.  Pain with any range of motion.   Moderate fullness in anterior shoulder.  Elbow wrist and lower arm nontender.  Patient neurovascularly intact.  Normal range of motion bilateral lower extremities.  Neurological: She is alert and oriented to person, place, and time. She has normal strength. She exhibits normal muscle tone. Coordination normal. GCS eye subscore is 4. GCS verbal subscore is 5. GCS motor subscore is 6.  Skin: Skin is warm, dry and intact.  Psychiatric: She has a normal mood and affect.     ED Treatments / Results  Labs (all labs ordered are listed, but only abnormal results are displayed) Labs Reviewed - No data to display  EKG None  Radiology Dg Shoulder Left  Result Date: 05/20/2017 CLINICAL DATA:  Left shoulder pain following a fall today. EXAM: LEFT SHOULDER - 2+ VIEW COMPARISON:  None. FINDINGS: Minimally impacted left humeral neck fracture without significant displacement or angulation. Minimal glenohumeral joint degenerative spur formation. No dislocation. IMPRESSION: Minimally impacted left humeral neck fracture. Electronically Signed   By: Claudie Revering M.D.   On: 05/20/2017 15:40    Procedures Procedures (including critical care time)  Medications Ordered in ED Medications  oxyCODONE-acetaminophen (PERCOCET/ROXICET) 5-325 MG per tablet 1 tablet (has no administration in time range)     Initial Impression / Assessment and Plan / ED Course  I have reviewed the triage vital signs and the nursing notes.  Pertinent labs & imaging results that were available during my care of the patient were reviewed by me and considered in my medical decision making (see chart for details).      Final Clinical Impressions(s) / ED Diagnoses   Final diagnoses:  Closed fracture of proximal end of left humerus, unspecified fracture morphology, initial encounter  Fall, initial encounter  's ray shows impacted but minimally displaced proximal humerus fracture.  No dislocation.  Patient did not sustain other  injury.  At this time will place in sling and recommend orthopedic follow-up with home pain control measures reviewed.  ED Discharge Orders    None       Charlesetta Shanks, MD 05/20/17 [phone]

## 2017-05-20 NOTE — ED Notes (Signed)
This nurse removed the pt's IV in her L AC.  Site was clean and dry and catheter was intact.  No redness, draining or edema/

## 2017-05-23 DIAGNOSIS — L570 Actinic keratosis: Secondary | ICD-10-CM | POA: Diagnosis not present

## 2017-05-23 DIAGNOSIS — L820 Inflamed seborrheic keratosis: Secondary | ICD-10-CM | POA: Diagnosis not present

## 2017-05-24 DIAGNOSIS — S42295A Other nondisplaced fracture of upper end of left humerus, initial encounter for closed fracture: Secondary | ICD-10-CM | POA: Diagnosis not present

## 2017-05-26 DIAGNOSIS — R05 Cough: Secondary | ICD-10-CM | POA: Diagnosis not present

## 2017-05-31 DIAGNOSIS — S42202D Unspecified fracture of upper end of left humerus, subsequent encounter for fracture with routine healing: Secondary | ICD-10-CM | POA: Diagnosis not present

## 2017-06-01 DIAGNOSIS — M25512 Pain in left shoulder: Secondary | ICD-10-CM | POA: Diagnosis not present

## 2017-06-02 DIAGNOSIS — M25512 Pain in left shoulder: Secondary | ICD-10-CM | POA: Diagnosis not present

## 2017-06-07 DIAGNOSIS — S42202D Unspecified fracture of upper end of left humerus, subsequent encounter for fracture with routine healing: Secondary | ICD-10-CM | POA: Diagnosis not present

## 2017-06-22 DIAGNOSIS — S42202D Unspecified fracture of upper end of left humerus, subsequent encounter for fracture with routine healing: Secondary | ICD-10-CM | POA: Diagnosis not present

## 2017-06-27 DIAGNOSIS — S42202D Unspecified fracture of upper end of left humerus, subsequent encounter for fracture with routine healing: Secondary | ICD-10-CM | POA: Diagnosis not present

## 2017-06-30 DIAGNOSIS — S42202D Unspecified fracture of upper end of left humerus, subsequent encounter for fracture with routine healing: Secondary | ICD-10-CM | POA: Diagnosis not present

## 2017-07-05 DIAGNOSIS — S42202D Unspecified fracture of upper end of left humerus, subsequent encounter for fracture with routine healing: Secondary | ICD-10-CM | POA: Diagnosis not present

## 2017-07-07 DIAGNOSIS — S42202D Unspecified fracture of upper end of left humerus, subsequent encounter for fracture with routine healing: Secondary | ICD-10-CM | POA: Diagnosis not present

## 2017-07-11 DIAGNOSIS — S42202D Unspecified fracture of upper end of left humerus, subsequent encounter for fracture with routine healing: Secondary | ICD-10-CM | POA: Diagnosis not present

## 2017-07-14 DIAGNOSIS — S42202D Unspecified fracture of upper end of left humerus, subsequent encounter for fracture with routine healing: Secondary | ICD-10-CM | POA: Diagnosis not present

## 2017-07-18 DIAGNOSIS — S42202D Unspecified fracture of upper end of left humerus, subsequent encounter for fracture with routine healing: Secondary | ICD-10-CM | POA: Diagnosis not present

## 2017-07-19 DIAGNOSIS — S42202D Unspecified fracture of upper end of left humerus, subsequent encounter for fracture with routine healing: Secondary | ICD-10-CM | POA: Diagnosis not present

## 2017-09-06 ENCOUNTER — Encounter

## 2017-09-06 ENCOUNTER — Ambulatory Visit (INDEPENDENT_AMBULATORY_CARE_PROVIDER_SITE_OTHER): Payer: Medicare Other | Admitting: Diagnostic Neuroimaging

## 2017-09-06 VITALS — BP 137/84 | HR 73 | Ht 66.0 in | Wt 166.2 lb

## 2017-09-06 DIAGNOSIS — G629 Polyneuropathy, unspecified: Secondary | ICD-10-CM | POA: Diagnosis not present

## 2017-09-06 NOTE — Progress Notes (Signed)
[facility]  PATIENT: Jodi Miller DOB: 1934-08-16  REFERRING CLINICIAN: Merita Norton HISTORY FROM: patient  REASON FOR VISIT: follow up   HISTORICAL  CHIEF COMPLAINT:  Chief Complaint  Patient presents with  . Follow-up    Room 7 here alone Neuropathy patient reports the gabapentin has not been helping. She has had a lot of numbness in her feet. Patient fell on 05/20/2017 and broke left arm    HISTORY OF PRESENT ILLNESS:   UPDATE (09/06/17 , VRP): Since last visit, fell and broke left arm 05/20/17. Balance symptoms are stable. Numbness and cramps in legs are stable. No alleviating or aggravating factors. Tolerating gabapentin 100 / 200.    PRIOR HPI (11/10/16): 83 year old female here for evaluation of neuropathy.  Patient previously lived in [place].  In May 07, 2012 she developed onset of numbness and tingling in her toes, feet, legs.  This gradually worsened over time.  She developed muscle cramps in her bilateral calves.  Symptoms worse in the morning, slightly better later in the day and with activity.  Her balance has been slightly off.  She saw a neurologist in [place], had extensive testing including nerve conduction study, MRI of lumbar spine, lab testing.  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  Patient went up to 400 mg twice a day but had some side effects, and now is on gabapentin 200 mg twice a day.  She feels like symptoms are slightly better with the medication but she is not sure.  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  In 2014-2016 patient had significant increase in alcohol use, around the same time of significantly increased stress related to her late husband's ailing health.  He passed away in May 08, 2014 patient's drinking continued excessively for the next year.  Ultimately she checked herself into detox program and rehabilitation in 2015/05/08.  She has been abstinent from  alcohol for the past 1-1/2 years.  Patient denies any burning, pins and needles, tingling in her toes or feet.  She has some low back pain issues.  She had epidural steroid injections in the past.  Patient has stayed fairly active, including exercise classes at the gym, yoga and other activities.  Patient enjoys dance as well.  Patient does have some muscle cramps and restlessness, late in the evening, especially when she is trying to sleep.  She typically uses packets of mustard which helps relieve her cramps.   REVIEW OF SYSTEMS: Full 14 system review of systems performed and negative with exception of: Bladder incontinence restless legs muscle cramps.   ALLERGIES: Allergies  Allergen Reactions  . Sulfa Antibiotics Rash    HOME MEDICATIONS: Outpatient Medications Prior to Visit  Medication Sig Dispense Refill  . budesonide (ENTOCORT EC) 3 MG 24 hr capsule Take 3 mg by mouth daily.    . Cholecalciferol (VITAMIN D) 2000 units tablet Take 2,000 Units by mouth daily.    Marland Kitchen gabapentin (NEURONTIN) 100 MG capsule Take 2 capsules by mouth. 1 tab in the am 2 in the pm.  5  . lansoprazole (PREVACID) 30 MG capsule Take 1 capsule by mouth daily.    . meloxicam (MOBIC) 7.5 MG tablet Take 1 tablet by mouth 2 (two) times daily.    . mirabegron ER (MYRBETRIQ) 25 MG TB24 tablet Take 1 tablet by mouth daily.    . mirtazapine (REMERON) 7.5 MG tablet Take 1 tablet by mouth at bedtime.    Marland Kitchen  Omega-3 Fatty Acids (FISH OIL) 1000 MG CAPS Take 1 capsule by mouth daily.    Marland Kitchen docusate sodium (COLACE) 100 MG capsule Take 1 capsule (100 mg total) by mouth every 12 (twelve) hours. 60 capsule 0  . oxyCODONE-acetaminophen (PERCOCET) 5-325 MG tablet Take 1-2 tablets by mouth every 6 (six) hours as needed. 20 tablet 0  . venlafaxine (EFFEXOR) 37.5 MG tablet Take 37.5 mg by mouth 2 (two) times daily.     No facility-administered medications prior to visit.     PAST MEDICAL HISTORY: Past Medical History:    Diagnosis Date  . Arthritis   . GERD (gastroesophageal reflux disease)   . Peripheral neuropathy   . Stool incontinence   . Urge incontinence   . Vertigo     PAST SURGICAL HISTORY: Past Surgical History:  Procedure Laterality Date  . bletharoplaasty  2017  . BREAST LUMPECTOMY Right   . CATARACT EXTRACTION, BILATERAL  2016  . FACIAL COSMETIC SURGERY    . NASAL SEPTUM SURGERY    . TONSILLECTOMY      FAMILY HISTORY: Family History  Problem Relation Age of Onset  . Heart disease Mother   . Dementia Mother   . Heart failure Father   . Lupus Father   . Emphysema Father     SOCIAL HISTORY:  Social History   Socioeconomic History  . Marital status: Single    Spouse name: Not on file  . Number of children: Not on file  . Years of education: Not on file  . Highest education level: Not on file  Occupational History  . Not on file  Social Needs  . Financial resource strain: Not on file  . Food insecurity:    Worry: Not on file    Inability: Not on file  . Transportation needs:    Medical: Not on file    Non-medical: Not on file  Tobacco Use  . Smoking status: Former Smoker    Packs/day: 1.00    Types: Cigarettes    Last attempt to quit: 11/09/1996    Years since quitting: 20.8  . Smokeless tobacco: Never Used  Substance and Sexual Activity  . Alcohol use: No  . Drug use: No  . Sexual activity: Not on file  Lifestyle  . Physical activity:    Days per week: Not on file    Minutes per session: Not on file  . Stress: Not on file  Relationships  . Social connections:    Talks on phone: Not on file    Gets together: Not on file    Attends religious service: Not on file    Active member of club or organization: Not on file    Attends meetings of clubs or organizations: Not on file    Relationship status: Not on file  . Intimate partner violence:    Fear of current or ex partner: Not on file    Emotionally abused: Not on file    Physically abused: Not on  file    Forced sexual activity: Not on file  Other Topics Concern  . Not on file  Social History Narrative  . Not on file     PHYSICAL EXAM  GENERAL EXAM/CONSTITUTIONAL: Vitals:  Vitals:   09/06/17 1124  BP: 137/84  Pulse: 73  Weight: 166 lb 4 oz (75.4 kg)  Height: 5\' 6"  (1.676 m)   Body mass index is 26.83 kg/m. No exam data present  Patient is in no distress; well developed, nourished and  groomed; neck is supple  CARDIOVASCULAR:  Examination of carotid arteries is normal; no carotid bruits  Regular rate and rhythm, no murmurs  Examination of peripheral vascular system by observation and palpation is normal  EYES:  Ophthalmoscopic exam of optic discs and posterior segments is normal; no papilledema or hemorrhages  MUSCULOSKELETAL:  Gait, strength, tone, movements noted in Neurologic exam below  NEUROLOGIC: MENTAL STATUS:  No flowsheet data found.  awake, alert, oriented to person, place and time  recent and remote memory intact  normal attention and concentration  language fluent, comprehension intact, naming intact,   fund of knowledge appropriate  CRANIAL NERVE:   2nd - no papilledema on fundoscopic exam  2nd, 3rd, 4th, 6th - pupils equal and reactive to light, visual fields full to confrontation, extraocular muscles intact, no nystagmus  5th - facial sensation symmetric  7th - facial strength symmetric  8th - hearing intact  9th - palate elevates symmetrically, uvula midline  11th - shoulder shrug symmetric  12th - tongue protrusion midline  MOTOR:   normal bulk and tone, full strength in the BUE, BLE  SENSORY:   normal and symmetric to light touch, temperature  DECR PP IN FINGERS AND LOWER EXT  DECR VIB AT TOES AND ANKLES  COORDINATION:   finger-nose-finger, fine finger movements normal  REFLEXES:   deep tendon reflexes --> BUE 1; KNEES TRACE; ANKLES 0  GAIT/STATION:   narrow based gait; UNSTEADY WITH FEET TOGETHER  AND EYES OPEN    DIAGNOSTIC DATA (LABS, IMAGING, TESTING) - I reviewed patient records, labs, notes, testing and imaging myself where available.  No results found for: WBC, HGB, HCT, MCV, PLT No results found for: NA, K, CL, CO2, GLUCOSE, BUN, CREATININE, CALCIUM, PROT, ALBUMIN, AST, ALT, ALKPHOS, BILITOT, GFRNONAA, GFRAA No results found for: CHOL, HDL, LDLCALC, LDLDIRECT, TRIG, CHOLHDL No results found for: HGBA1C No results found for: VITAMINB12 No results found for: TSH   B12, TSH, glucose - normal  05/20/17 XRAY LEFT SHOULDER  - Minimally impacted left humeral neck fracture.     ASSESSMENT AND PLAN  83 y.o. year old female here with history of numbness and tingling in toes and feet, muscle cramps in calves, since 2014.  This was in the setting of excessive alcohol use.  Symptoms have stabilized since stopping alcohol.  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  She may also have some component of lumbar degenerative spine disease.  Gabapentin was slightly helping her symptoms but patient is not sure how effective this is.  We will taper her off and see if her symptoms necessitate treatment.  In future we could go back to gabapentin, or consider Lyrica or Cymbalta.   Dx:  1. Neuropathy     PLAN:  I spent 15 minutes of face to face time with patient. Greater than 50% of time was spent in counseling and coordination of care with patient. This is necessary because patient's symptoms are not optimally controlled / improved. In summary we discussed: - Diagnostic results, impressions, or recommended diagnostic studies: prior workup; dx of idiopathic neuropathy vs etoh - Prognosis: fait - Risks and benefits of management (treatment) options: gabapentin for pain control  IDIOPATHIC NEUROPATHY (vs etoh neuropathy) - stable symptoms; patient wants to try tapering off gabapentin (reduce 1-2 tabs per week) to see if still effective or  not  Return if symptoms worsen or fail to improve. may follow up as needed    Penni Bombard, MD 09/06/2017, 11:29  AM Certified in Neurology, Neurophysiology and [facility] [address] [place], [place] [address] [phone]

## 2017-09-08 ENCOUNTER — Encounter: Payer: Self-pay | Admitting: Diagnostic Neuroimaging

## 2017-09-20 DIAGNOSIS — Z23 Encounter for immunization: Secondary | ICD-10-CM | POA: Diagnosis not present

## 2017-09-22 DIAGNOSIS — G629 Polyneuropathy, unspecified: Secondary | ICD-10-CM | POA: Diagnosis not present

## 2017-09-22 DIAGNOSIS — N3946 Mixed incontinence: Secondary | ICD-10-CM | POA: Diagnosis not present

## 2017-09-22 DIAGNOSIS — F4323 Adjustment disorder with mixed anxiety and depressed mood: Secondary | ICD-10-CM | POA: Diagnosis not present

## 2017-09-22 DIAGNOSIS — R7309 Other abnormal glucose: Secondary | ICD-10-CM | POA: Diagnosis not present

## 2017-09-22 DIAGNOSIS — Z853 Personal history of malignant neoplasm of breast: Secondary | ICD-10-CM | POA: Diagnosis not present

## 2017-09-22 DIAGNOSIS — L989 Disorder of the skin and subcutaneous tissue, unspecified: Secondary | ICD-10-CM | POA: Diagnosis not present

## 2017-09-22 DIAGNOSIS — F5101 Primary insomnia: Secondary | ICD-10-CM | POA: Diagnosis not present

## 2017-09-22 DIAGNOSIS — R030 Elevated blood-pressure reading, without diagnosis of hypertension: Secondary | ICD-10-CM | POA: Diagnosis not present

## 2017-09-22 DIAGNOSIS — K52831 Collagenous colitis: Secondary | ICD-10-CM | POA: Diagnosis not present

## 2017-09-22 DIAGNOSIS — Z79899 Other long term (current) drug therapy: Secondary | ICD-10-CM | POA: Diagnosis not present

## 2017-09-22 DIAGNOSIS — E559 Vitamin D deficiency, unspecified: Secondary | ICD-10-CM | POA: Diagnosis not present

## 2017-09-22 DIAGNOSIS — G4762 Sleep related leg cramps: Secondary | ICD-10-CM | POA: Diagnosis not present

## 2017-10-25 DIAGNOSIS — N952 Postmenopausal atrophic vaginitis: Secondary | ICD-10-CM | POA: Diagnosis not present

## 2017-10-25 DIAGNOSIS — N3946 Mixed incontinence: Secondary | ICD-10-CM | POA: Diagnosis not present

## 2017-12-26 DIAGNOSIS — K52831 Collagenous colitis: Secondary | ICD-10-CM | POA: Diagnosis not present

## 2018-02-07 DIAGNOSIS — N3946 Mixed incontinence: Secondary | ICD-10-CM | POA: Diagnosis not present

## 2018-02-07 DIAGNOSIS — N952 Postmenopausal atrophic vaginitis: Secondary | ICD-10-CM | POA: Diagnosis not present

## 2018-03-15 DIAGNOSIS — Z853 Personal history of malignant neoplasm of breast: Secondary | ICD-10-CM | POA: Diagnosis not present

## 2018-03-15 DIAGNOSIS — Z1231 Encounter for screening mammogram for malignant neoplasm of breast: Secondary | ICD-10-CM | POA: Diagnosis not present

## 2018-03-21 DIAGNOSIS — L219 Seborrheic dermatitis, unspecified: Secondary | ICD-10-CM | POA: Diagnosis not present

## 2018-03-21 DIAGNOSIS — L820 Inflamed seborrheic keratosis: Secondary | ICD-10-CM | POA: Diagnosis not present

## 2018-03-21 DIAGNOSIS — Z23 Encounter for immunization: Secondary | ICD-10-CM | POA: Diagnosis not present

## 2018-03-21 DIAGNOSIS — L570 Actinic keratosis: Secondary | ICD-10-CM | POA: Diagnosis not present

## 2018-03-23 DIAGNOSIS — Z79899 Other long term (current) drug therapy: Secondary | ICD-10-CM | POA: Diagnosis not present

## 2018-03-23 DIAGNOSIS — N952 Postmenopausal atrophic vaginitis: Secondary | ICD-10-CM | POA: Diagnosis not present

## 2018-03-23 DIAGNOSIS — G4762 Sleep related leg cramps: Secondary | ICD-10-CM | POA: Diagnosis not present

## 2018-03-23 DIAGNOSIS — K52831 Collagenous colitis: Secondary | ICD-10-CM | POA: Diagnosis not present

## 2018-03-23 DIAGNOSIS — R7309 Other abnormal glucose: Secondary | ICD-10-CM | POA: Diagnosis not present

## 2018-03-23 DIAGNOSIS — E559 Vitamin D deficiency, unspecified: Secondary | ICD-10-CM | POA: Diagnosis not present

## 2018-03-23 DIAGNOSIS — R030 Elevated blood-pressure reading, without diagnosis of hypertension: Secondary | ICD-10-CM | POA: Diagnosis not present

## 2018-03-23 DIAGNOSIS — F4323 Adjustment disorder with mixed anxiety and depressed mood: Secondary | ICD-10-CM | POA: Diagnosis not present

## 2018-03-23 DIAGNOSIS — F5101 Primary insomnia: Secondary | ICD-10-CM | POA: Diagnosis not present

## 2018-03-23 DIAGNOSIS — G629 Polyneuropathy, unspecified: Secondary | ICD-10-CM | POA: Diagnosis not present

## 2018-03-23 DIAGNOSIS — L989 Disorder of the skin and subcutaneous tissue, unspecified: Secondary | ICD-10-CM | POA: Diagnosis not present

## 2018-03-23 DIAGNOSIS — N3946 Mixed incontinence: Secondary | ICD-10-CM | POA: Diagnosis not present

## 2018-04-18 DIAGNOSIS — G629 Polyneuropathy, unspecified: Secondary | ICD-10-CM | POA: Diagnosis not present

## 2018-04-18 DIAGNOSIS — F4323 Adjustment disorder with mixed anxiety and depressed mood: Secondary | ICD-10-CM | POA: Diagnosis not present

## 2018-04-18 DIAGNOSIS — F5101 Primary insomnia: Secondary | ICD-10-CM | POA: Diagnosis not present

## 2018-09-01 DIAGNOSIS — H35371 Puckering of macula, right eye: Secondary | ICD-10-CM | POA: Diagnosis not present

## 2018-09-01 DIAGNOSIS — H35033 Hypertensive retinopathy, bilateral: Secondary | ICD-10-CM | POA: Diagnosis not present

## 2018-09-01 DIAGNOSIS — H353131 Nonexudative age-related macular degeneration, bilateral, early dry stage: Secondary | ICD-10-CM | POA: Diagnosis not present

## 2018-09-01 DIAGNOSIS — H2640 Unspecified secondary cataract: Secondary | ICD-10-CM | POA: Diagnosis not present

## 2018-09-07 DIAGNOSIS — Z23 Encounter for immunization: Secondary | ICD-10-CM | POA: Diagnosis not present

## 2018-09-08 DIAGNOSIS — Z961 Presence of intraocular lens: Secondary | ICD-10-CM | POA: Diagnosis not present

## 2018-09-08 DIAGNOSIS — H26492 Other secondary cataract, left eye: Secondary | ICD-10-CM | POA: Diagnosis not present

## 2018-09-26 DIAGNOSIS — F4323 Adjustment disorder with mixed anxiety and depressed mood: Secondary | ICD-10-CM | POA: Diagnosis not present

## 2018-09-26 DIAGNOSIS — Z0000 Encounter for general adult medical examination without abnormal findings: Secondary | ICD-10-CM | POA: Diagnosis not present

## 2018-09-26 DIAGNOSIS — F5101 Primary insomnia: Secondary | ICD-10-CM | POA: Diagnosis not present

## 2018-09-26 DIAGNOSIS — E559 Vitamin D deficiency, unspecified: Secondary | ICD-10-CM | POA: Diagnosis not present

## 2018-09-26 DIAGNOSIS — G629 Polyneuropathy, unspecified: Secondary | ICD-10-CM | POA: Diagnosis not present

## 2018-09-26 DIAGNOSIS — N63 Unspecified lump in unspecified breast: Secondary | ICD-10-CM | POA: Diagnosis not present

## 2018-09-26 DIAGNOSIS — Z5181 Encounter for therapeutic drug level monitoring: Secondary | ICD-10-CM | POA: Diagnosis not present

## 2018-09-27 DIAGNOSIS — Z5181 Encounter for therapeutic drug level monitoring: Secondary | ICD-10-CM | POA: Diagnosis not present

## 2018-09-27 DIAGNOSIS — G629 Polyneuropathy, unspecified: Secondary | ICD-10-CM | POA: Diagnosis not present

## 2018-09-27 DIAGNOSIS — F4323 Adjustment disorder with mixed anxiety and depressed mood: Secondary | ICD-10-CM | POA: Diagnosis not present

## 2018-09-27 DIAGNOSIS — F5101 Primary insomnia: Secondary | ICD-10-CM | POA: Diagnosis not present

## 2018-09-27 DIAGNOSIS — E559 Vitamin D deficiency, unspecified: Secondary | ICD-10-CM | POA: Diagnosis not present

## 2018-10-10 DIAGNOSIS — N6459 Other signs and symptoms in breast: Secondary | ICD-10-CM | POA: Diagnosis not present

## 2018-10-10 DIAGNOSIS — N6312 Unspecified lump in the right breast, upper inner quadrant: Secondary | ICD-10-CM | POA: Diagnosis not present

## 2018-10-23 DIAGNOSIS — N6312 Unspecified lump in the right breast, upper inner quadrant: Secondary | ICD-10-CM | POA: Diagnosis not present

## 2019-01-29 DIAGNOSIS — C50919 Malignant neoplasm of unspecified site of unspecified female breast: Secondary | ICD-10-CM | POA: Diagnosis not present

## 2019-01-29 DIAGNOSIS — Z1331 Encounter for screening for depression: Secondary | ICD-10-CM | POA: Diagnosis not present

## 2019-01-29 DIAGNOSIS — M1990 Unspecified osteoarthritis, unspecified site: Secondary | ICD-10-CM | POA: Diagnosis not present

## 2019-01-29 DIAGNOSIS — G629 Polyneuropathy, unspecified: Secondary | ICD-10-CM | POA: Diagnosis not present

## 2019-01-29 DIAGNOSIS — F329 Major depressive disorder, single episode, unspecified: Secondary | ICD-10-CM | POA: Diagnosis not present

## 2019-02-08 ENCOUNTER — Ambulatory Visit: Payer: Medicare Other | Attending: Internal Medicine

## 2019-02-08 DIAGNOSIS — Z23 Encounter for immunization: Secondary | ICD-10-CM | POA: Insufficient documentation

## 2019-02-08 NOTE — Progress Notes (Signed)
   Covid-19 Vaccination Clinic  Name:  Jodi Miller    MRN: [id number] DOB: 18-Oct-1934  02/08/2019  [name] was observed post Covid-19 immunization for 15 minutes without incidence. She was provided with Vaccine Information Sheet and instruction to access the V-Safe system.   [name] was instructed to call 911 with any severe reactions post vaccine: Marland Kitchen Difficulty breathing  . Swelling of your face and throat  . A fast heartbeat  . A bad rash all over your body  . Dizziness and weakness    Immunizations Administered    Name Date Dose VIS Date Route   Pfizer COVID-19 Vaccine 02/08/2019 12:41 PM 0.3 mL 12/29/2018 Intramuscular   Manufacturer: [place]   Lot: [id number]   [place]: [id number]

## 2019-02-27 DIAGNOSIS — R928 Other abnormal and inconclusive findings on diagnostic imaging of breast: Secondary | ICD-10-CM | POA: Diagnosis not present

## 2019-02-27 DIAGNOSIS — N6081 Other benign mammary dysplasias of right breast: Secondary | ICD-10-CM | POA: Diagnosis not present

## 2019-02-28 ENCOUNTER — Ambulatory Visit: Payer: Medicare Other | Attending: Internal Medicine

## 2019-02-28 DIAGNOSIS — Z23 Encounter for immunization: Secondary | ICD-10-CM | POA: Insufficient documentation

## 2019-02-28 NOTE — Progress Notes (Signed)
   Covid-19 Vaccination Clinic  Name:  Jodi Miller    MRN: [id number] DOB: 1934/05/28  02/28/2019  Jodi Miller was observed post Covid-19 immunization for 15 minutes without incidence. She was provided with Vaccine Information Sheet and instruction to access the V-Safe system.   Jodi Miller was instructed to call 911 with any severe reactions post vaccine: Marland Kitchen Difficulty breathing  . Swelling of your face and throat  . A fast heartbeat  . A bad rash all over your body  . Dizziness and weakness    Immunizations Administered    Name Date Dose VIS Date Route   Pfizer COVID-19 Vaccine 02/28/2019  4:53 PM 0.3 mL 12/29/2018 Intramuscular   Manufacturer: [place]   Lot: [id number]   [place]: [id number]

## 2019-03-12 DIAGNOSIS — H35033 Hypertensive retinopathy, bilateral: Secondary | ICD-10-CM | POA: Diagnosis not present

## 2019-03-12 DIAGNOSIS — H35373 Puckering of macula, bilateral: Secondary | ICD-10-CM | POA: Diagnosis not present

## 2019-03-12 DIAGNOSIS — H353131 Nonexudative age-related macular degeneration, bilateral, early dry stage: Secondary | ICD-10-CM | POA: Diagnosis not present

## 2019-03-12 DIAGNOSIS — H35363 Drusen (degenerative) of macula, bilateral: Secondary | ICD-10-CM | POA: Diagnosis not present

## 2019-04-02 DIAGNOSIS — L570 Actinic keratosis: Secondary | ICD-10-CM | POA: Diagnosis not present

## 2019-04-02 DIAGNOSIS — L821 Other seborrheic keratosis: Secondary | ICD-10-CM | POA: Diagnosis not present

## 2019-04-02 DIAGNOSIS — L700 Acne vulgaris: Secondary | ICD-10-CM | POA: Diagnosis not present

## 2019-04-02 DIAGNOSIS — Z7689 Persons encountering health services in other specified circumstances: Secondary | ICD-10-CM | POA: Diagnosis not present

## 2019-04-28 IMAGING — DX DG RIBS W/ CHEST 3+V BILAT
5 series · 5 of 5 positions shown · non-contrast
Comparison: None.

CLINICAL DATA: Bilateral rib pain after fall.

EXAM:
BILATERAL RIBS AND CHEST - 4+ VIEW

[chest pa]
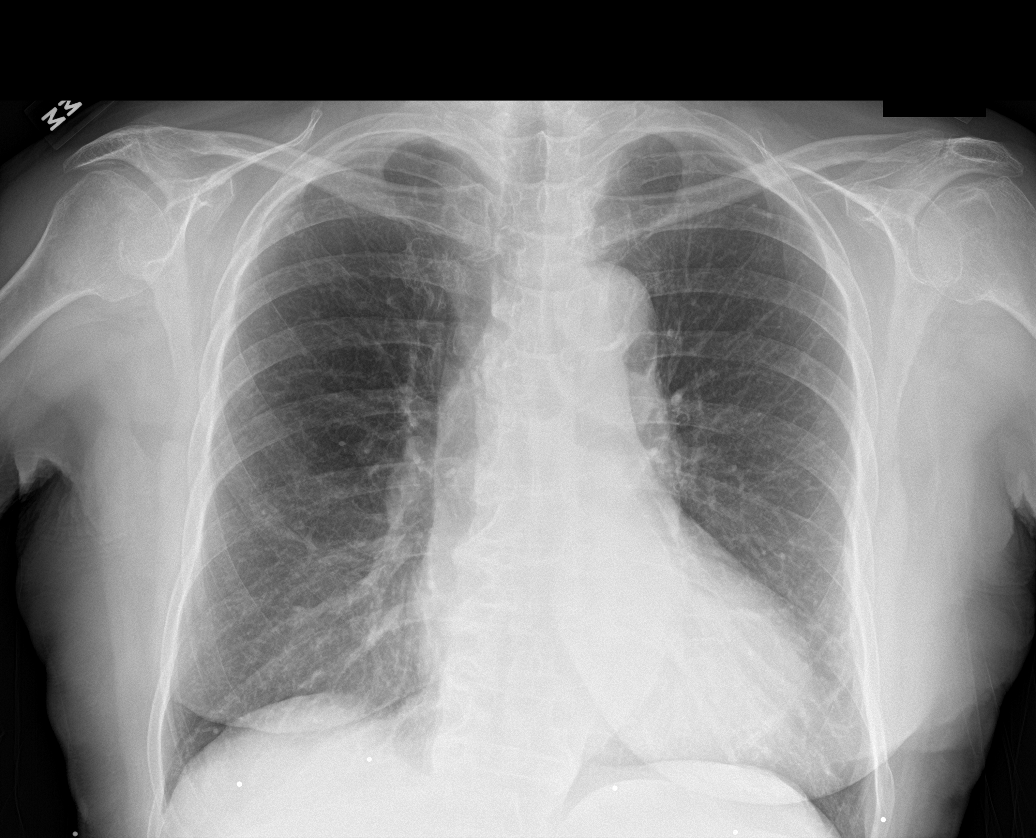

[rib obl (1 of 2)]
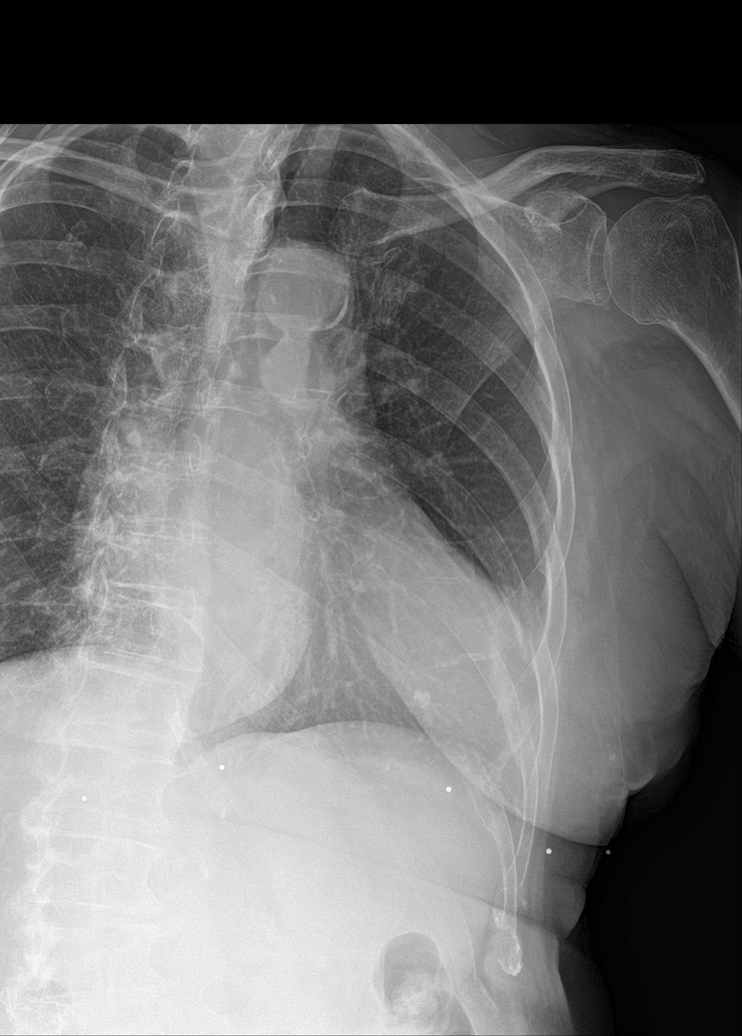

[rib obl (2 of 2)]
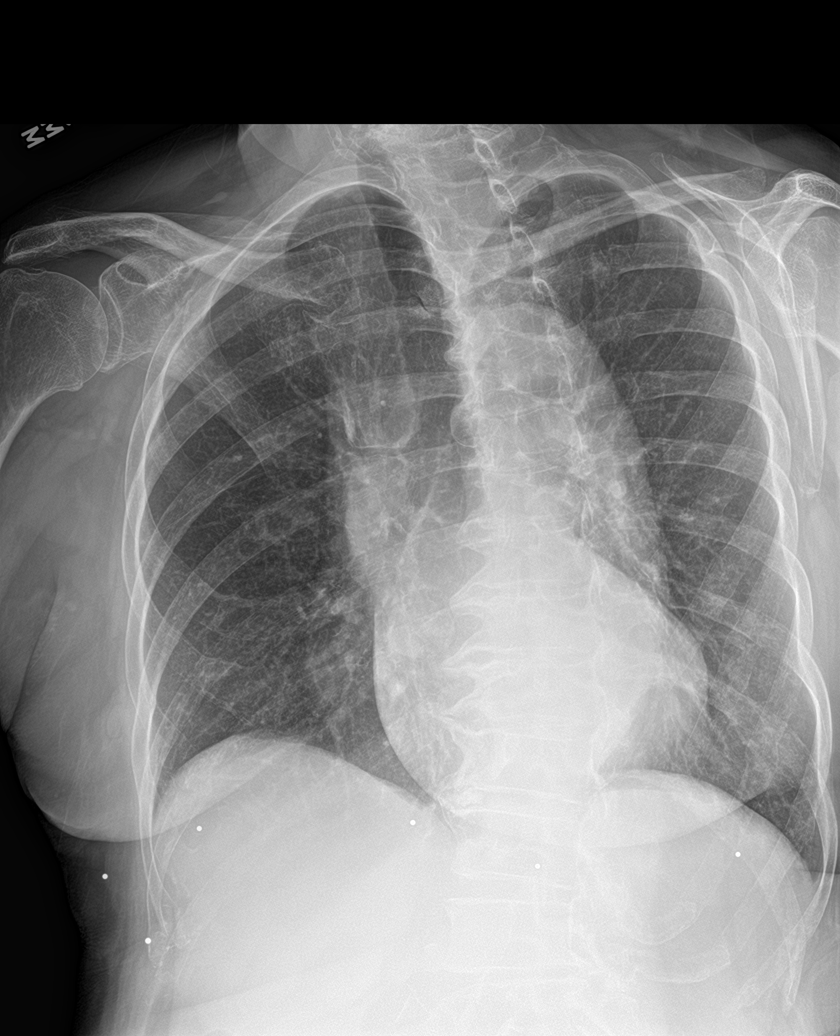

[rib pa (1 of 2)]
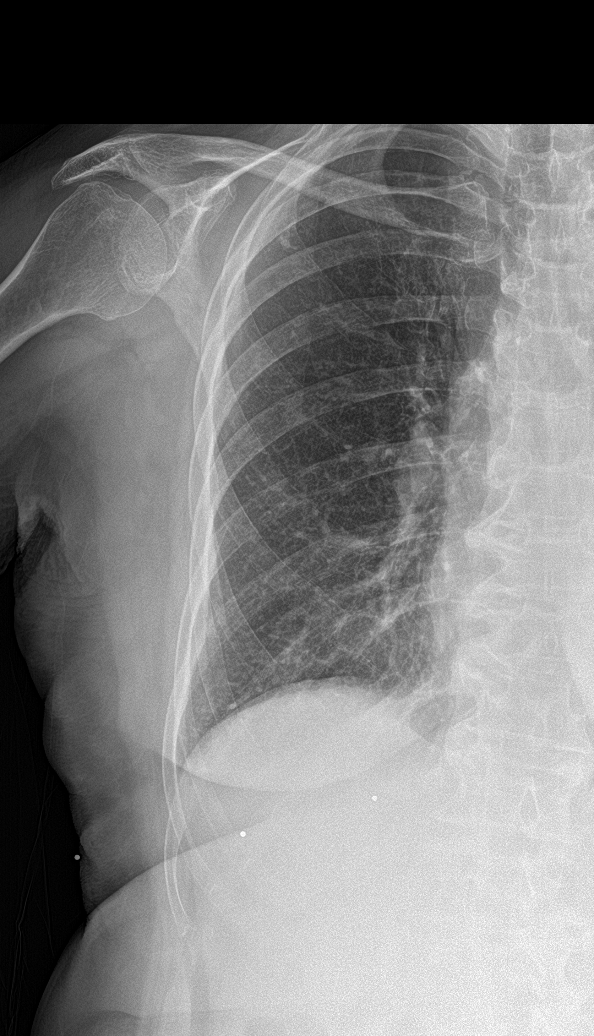

[rib pa (2 of 2)]
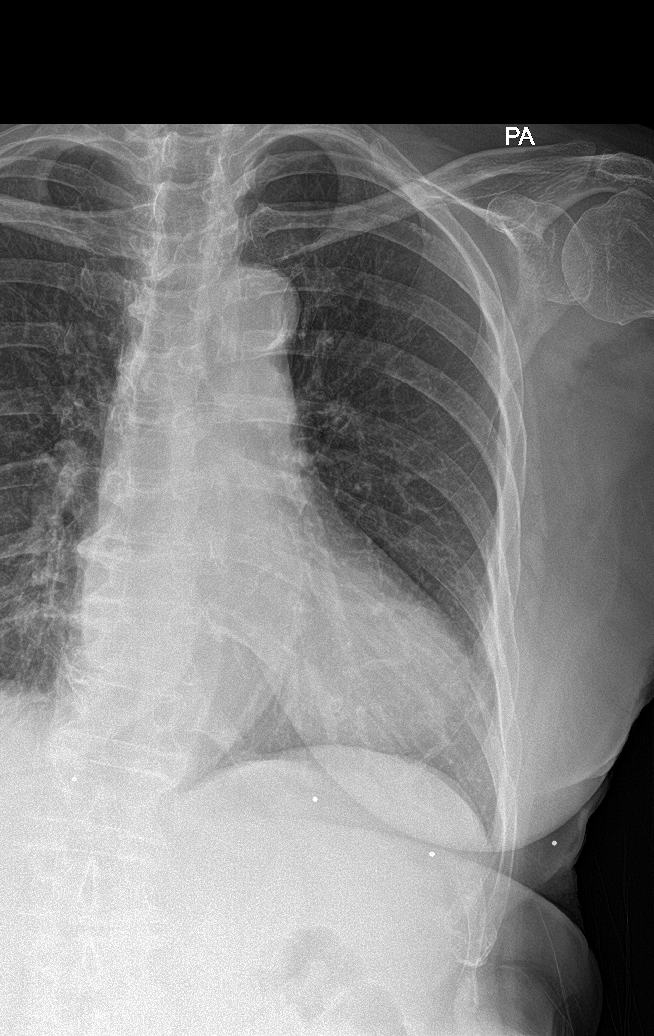

[5 of 5 positions shown; findings below may reference images not displayed]

FINDINGS: No acute fracture involving the ribs. Old healed left fifth through
ninth anterolateral rib fractures.

Mild cardiomegaly. Normal mediastinal contours. Atherosclerotic
calcification of the aortic arch. Normal pulmonary vascularity. No
focal consolidation, pleural effusion, or pneumothorax.
IMPRESSION: 1. No acute rib fracture.  Old healed left-sided rib fractures.
2. Mild cardiomegaly.  No active cardiopulmonary disease.

## 2019-06-13 ENCOUNTER — Other Ambulatory Visit: Payer: Self-pay

## 2019-06-13 ENCOUNTER — Encounter: Payer: Self-pay | Admitting: Podiatry

## 2019-06-13 ENCOUNTER — Ambulatory Visit (INDEPENDENT_AMBULATORY_CARE_PROVIDER_SITE_OTHER): Payer: Medicare Other | Admitting: Podiatry

## 2019-06-13 VITALS — Temp 97.0°F

## 2019-06-13 DIAGNOSIS — Q828 Other specified congenital malformations of skin: Secondary | ICD-10-CM | POA: Diagnosis not present

## 2019-06-13 DIAGNOSIS — G629 Polyneuropathy, unspecified: Secondary | ICD-10-CM

## 2019-06-14 NOTE — Progress Notes (Signed)
Subjective:   Patient ID: Jodi Miller, female   DOB: 85 y.o.   MRN: [id number]   HPI Patient presents stating she has a painful lesion plantar aspect right that she does not remember if she stepped on something.  She is concerned because she does not have feeling in her feet and also she cannot tell if there may be drainage or infection present.  Patient does not smoke and likes to be active   Review of Systems  All other systems reviewed and are negative.       Objective:  Physical Exam Vitals and nursing note reviewed.  Constitutional:      Appearance: She is well-developed.  Pulmonary:     Effort: Pulmonary effort is normal.  Musculoskeletal:        General: Normal range of motion.  Skin:    General: Skin is warm.  Neurological:     Mental Status: She is alert.     Neurovascular status was found to be intact muscle strength found to be adequate range of motion within normal limits.  Patient is noted to have a lesion on the plantar aspect right foot lateral side that is painful when pressed.  I did not note any proximal edema erythema or drainage associated with it and patient states that it feels like she is walking on a pebble.  I did note diminished sharp dull vibratory bilateral     Assessment:  Probability for a lesion that is soft tissue with possibility for foreign body or other pathology     Plan:  H&P reviewed condition and at this point did sterile debridement of the lesion with no iatrogenic drainage and I did not note any foreign body formation.  Good chance this may be porokeratotic and I advised her on this and I applied padding instructed her on shoe gear modifications daily inspection within the year and if any issues were to occur to let us know

## 2019-08-30 DIAGNOSIS — R1310 Dysphagia, unspecified: Secondary | ICD-10-CM | POA: Diagnosis not present

## 2019-08-30 DIAGNOSIS — Z87891 Personal history of nicotine dependence: Secondary | ICD-10-CM | POA: Diagnosis not present

## 2019-08-30 DIAGNOSIS — D649 Anemia, unspecified: Secondary | ICD-10-CM | POA: Diagnosis not present

## 2019-08-30 DIAGNOSIS — G629 Polyneuropathy, unspecified: Secondary | ICD-10-CM | POA: Diagnosis not present

## 2019-08-30 DIAGNOSIS — K2100 Gastro-esophageal reflux disease with esophagitis, without bleeding: Secondary | ICD-10-CM | POA: Diagnosis not present

## 2019-09-11 ENCOUNTER — Other Ambulatory Visit: Payer: Self-pay | Admitting: Gastroenterology

## 2019-09-11 DIAGNOSIS — R1310 Dysphagia, unspecified: Secondary | ICD-10-CM

## 2019-09-11 DIAGNOSIS — R1319 Other dysphagia: Secondary | ICD-10-CM | POA: Diagnosis not present

## 2019-09-18 ENCOUNTER — Other Ambulatory Visit: Payer: Medicare Other

## 2019-09-18 DIAGNOSIS — Z23 Encounter for immunization: Secondary | ICD-10-CM | POA: Diagnosis not present

## 2019-09-19 ENCOUNTER — Ambulatory Visit
Admission: RE | Admit: 2019-09-19 | Discharge: 2019-09-19 | Disposition: A | Discharge status: Home / Self Care | Payer: Medicare Other | Source: Ambulatory Visit | Attending: Gastroenterology | Admitting: Gastroenterology

## 2019-09-19 DIAGNOSIS — R1310 Dysphagia, unspecified: Secondary | ICD-10-CM | POA: Diagnosis not present

## 2019-09-21 DIAGNOSIS — Z1159 Encounter for screening for other viral diseases: Secondary | ICD-10-CM | POA: Diagnosis not present

## 2019-09-26 DIAGNOSIS — C169 Malignant neoplasm of stomach, unspecified: Secondary | ICD-10-CM | POA: Diagnosis not present

## 2019-09-26 DIAGNOSIS — R1310 Dysphagia, unspecified: Secondary | ICD-10-CM | POA: Diagnosis not present

## 2019-09-26 DIAGNOSIS — D490 Neoplasm of unspecified behavior of digestive system: Secondary | ICD-10-CM | POA: Diagnosis not present

## 2019-09-26 DIAGNOSIS — C155 Malignant neoplasm of lower third of esophagus: Secondary | ICD-10-CM | POA: Diagnosis not present

## 2019-09-26 DIAGNOSIS — R933 Abnormal findings on diagnostic imaging of other parts of digestive tract: Secondary | ICD-10-CM | POA: Diagnosis not present

## 2019-09-26 DIAGNOSIS — K52831 Collagenous colitis: Secondary | ICD-10-CM | POA: Diagnosis not present

## 2019-09-26 DIAGNOSIS — C159 Malignant neoplasm of esophagus, unspecified: Secondary | ICD-10-CM | POA: Diagnosis not present

## 2019-09-27 DIAGNOSIS — C159 Malignant neoplasm of esophagus, unspecified: Secondary | ICD-10-CM | POA: Diagnosis not present

## 2019-10-01 DIAGNOSIS — K5904 Chronic idiopathic constipation: Secondary | ICD-10-CM | POA: Diagnosis not present

## 2019-10-01 DIAGNOSIS — G479 Sleep disorder, unspecified: Secondary | ICD-10-CM | POA: Diagnosis not present

## 2019-10-01 DIAGNOSIS — C159 Malignant neoplasm of esophagus, unspecified: Secondary | ICD-10-CM | POA: Diagnosis not present

## 2019-10-01 DIAGNOSIS — K2100 Gastro-esophageal reflux disease with esophagitis, without bleeding: Secondary | ICD-10-CM | POA: Diagnosis not present

## 2019-10-04 DIAGNOSIS — C772 Secondary and unspecified malignant neoplasm of intra-abdominal lymph nodes: Secondary | ICD-10-CM | POA: Diagnosis not present

## 2019-10-04 DIAGNOSIS — R918 Other nonspecific abnormal finding of lung field: Secondary | ICD-10-CM | POA: Diagnosis not present

## 2019-10-04 DIAGNOSIS — C155 Malignant neoplasm of lower third of esophagus: Secondary | ICD-10-CM | POA: Diagnosis not present

## 2019-10-04 DIAGNOSIS — D649 Anemia, unspecified: Secondary | ICD-10-CM | POA: Diagnosis not present

## 2019-10-04 DIAGNOSIS — F329 Major depressive disorder, single episode, unspecified: Secondary | ICD-10-CM | POA: Diagnosis not present

## 2019-10-04 DIAGNOSIS — Z79899 Other long term (current) drug therapy: Secondary | ICD-10-CM | POA: Diagnosis not present

## 2019-10-04 DIAGNOSIS — G629 Polyneuropathy, unspecified: Secondary | ICD-10-CM | POA: Diagnosis not present

## 2019-10-04 DIAGNOSIS — C786 Secondary malignant neoplasm of retroperitoneum and peritoneum: Secondary | ICD-10-CM | POA: Diagnosis not present

## 2019-10-04 DIAGNOSIS — R252 Cramp and spasm: Secondary | ICD-10-CM | POA: Diagnosis not present

## 2019-10-04 DIAGNOSIS — R1310 Dysphagia, unspecified: Secondary | ICD-10-CM | POA: Diagnosis not present

## 2019-10-04 DIAGNOSIS — C160 Malignant neoplasm of cardia: Secondary | ICD-10-CM | POA: Diagnosis not present

## 2019-10-04 DIAGNOSIS — Z923 Personal history of irradiation: Secondary | ICD-10-CM | POA: Diagnosis not present

## 2019-10-04 DIAGNOSIS — K219 Gastro-esophageal reflux disease without esophagitis: Secondary | ICD-10-CM | POA: Diagnosis not present

## 2019-10-04 DIAGNOSIS — G2581 Restless legs syndrome: Secondary | ICD-10-CM | POA: Diagnosis not present

## 2019-10-04 DIAGNOSIS — Z87891 Personal history of nicotine dependence: Secondary | ICD-10-CM | POA: Diagnosis not present

## 2019-10-04 DIAGNOSIS — Z86000 Personal history of in-situ neoplasm of breast: Secondary | ICD-10-CM | POA: Diagnosis not present

## 2019-10-05 DIAGNOSIS — C160 Malignant neoplasm of cardia: Secondary | ICD-10-CM | POA: Diagnosis not present

## 2019-10-08 DIAGNOSIS — C155 Malignant neoplasm of lower third of esophagus: Secondary | ICD-10-CM | POA: Diagnosis not present

## 2019-10-08 DIAGNOSIS — C160 Malignant neoplasm of cardia: Secondary | ICD-10-CM | POA: Diagnosis not present

## 2019-10-08 DIAGNOSIS — C159 Malignant neoplasm of esophagus, unspecified: Secondary | ICD-10-CM | POA: Diagnosis not present

## 2019-10-08 DIAGNOSIS — K52831 Collagenous colitis: Secondary | ICD-10-CM | POA: Diagnosis not present

## 2019-10-08 DIAGNOSIS — G629 Polyneuropathy, unspecified: Secondary | ICD-10-CM | POA: Diagnosis not present

## 2019-10-09 DIAGNOSIS — R42 Dizziness and giddiness: Secondary | ICD-10-CM | POA: Diagnosis not present

## 2019-10-09 DIAGNOSIS — R110 Nausea: Secondary | ICD-10-CM | POA: Diagnosis not present

## 2019-10-09 DIAGNOSIS — C160 Malignant neoplasm of cardia: Secondary | ICD-10-CM | POA: Diagnosis not present

## 2019-10-10 DIAGNOSIS — C160 Malignant neoplasm of cardia: Secondary | ICD-10-CM | POA: Diagnosis not present

## 2019-10-10 DIAGNOSIS — C158 Malignant neoplasm of overlapping sites of esophagus: Secondary | ICD-10-CM | POA: Diagnosis not present

## 2019-10-12 DIAGNOSIS — K222 Esophageal obstruction: Secondary | ICD-10-CM | POA: Diagnosis not present

## 2019-10-12 DIAGNOSIS — C160 Malignant neoplasm of cardia: Secondary | ICD-10-CM | POA: Diagnosis not present

## 2019-10-14 DIAGNOSIS — Z20822 Contact with and (suspected) exposure to covid-19: Secondary | ICD-10-CM | POA: Diagnosis present

## 2019-10-14 DIAGNOSIS — L299 Pruritus, unspecified: Secondary | ICD-10-CM | POA: Diagnosis not present

## 2019-10-14 DIAGNOSIS — D509 Iron deficiency anemia, unspecified: Secondary | ICD-10-CM | POA: Diagnosis present

## 2019-10-14 DIAGNOSIS — G4733 Obstructive sleep apnea (adult) (pediatric): Secondary | ICD-10-CM | POA: Diagnosis present

## 2019-10-14 DIAGNOSIS — T402X5A Adverse effect of other opioids, initial encounter: Secondary | ICD-10-CM | POA: Diagnosis not present

## 2019-10-14 DIAGNOSIS — L500 Allergic urticaria: Secondary | ICD-10-CM | POA: Diagnosis not present

## 2019-10-14 DIAGNOSIS — G629 Polyneuropathy, unspecified: Secondary | ICD-10-CM | POA: Diagnosis present

## 2019-10-14 DIAGNOSIS — C160 Malignant neoplasm of cardia: Secondary | ICD-10-CM | POA: Diagnosis not present

## 2019-10-14 DIAGNOSIS — R1319 Other dysphagia: Secondary | ICD-10-CM | POA: Diagnosis present

## 2019-10-14 DIAGNOSIS — R140 Abdominal distension (gaseous): Secondary | ICD-10-CM | POA: Diagnosis not present

## 2019-10-14 DIAGNOSIS — R1310 Dysphagia, unspecified: Secondary | ICD-10-CM | POA: Diagnosis not present

## 2019-10-14 DIAGNOSIS — Z431 Encounter for attention to gastrostomy: Secondary | ICD-10-CM | POA: Diagnosis not present

## 2019-10-14 DIAGNOSIS — Z87891 Personal history of nicotine dependence: Secondary | ICD-10-CM | POA: Diagnosis not present

## 2019-10-14 DIAGNOSIS — K5900 Constipation, unspecified: Secondary | ICD-10-CM | POA: Diagnosis present

## 2019-10-14 DIAGNOSIS — C786 Secondary malignant neoplasm of retroperitoneum and peritoneum: Secondary | ICD-10-CM | POA: Diagnosis present

## 2019-10-14 DIAGNOSIS — C158 Malignant neoplasm of overlapping sites of esophagus: Secondary | ICD-10-CM | POA: Diagnosis present

## 2019-10-15 DIAGNOSIS — R1310 Dysphagia, unspecified: Secondary | ICD-10-CM | POA: Diagnosis not present

## 2019-10-16 DIAGNOSIS — C158 Malignant neoplasm of overlapping sites of esophagus: Secondary | ICD-10-CM | POA: Diagnosis not present

## 2019-10-16 DIAGNOSIS — C160 Malignant neoplasm of cardia: Secondary | ICD-10-CM | POA: Diagnosis not present

## 2019-10-16 DIAGNOSIS — R1319 Other dysphagia: Secondary | ICD-10-CM | POA: Diagnosis not present

## 2019-10-17 DIAGNOSIS — C158 Malignant neoplasm of overlapping sites of esophagus: Secondary | ICD-10-CM | POA: Diagnosis not present

## 2019-10-17 DIAGNOSIS — R140 Abdominal distension (gaseous): Secondary | ICD-10-CM | POA: Diagnosis not present

## 2019-10-17 DIAGNOSIS — R1319 Other dysphagia: Secondary | ICD-10-CM | POA: Diagnosis not present

## 2019-10-18 DIAGNOSIS — C158 Malignant neoplasm of overlapping sites of esophagus: Secondary | ICD-10-CM | POA: Diagnosis not present

## 2019-10-18 DIAGNOSIS — R1319 Other dysphagia: Secondary | ICD-10-CM | POA: Diagnosis not present

## 2019-10-19 DIAGNOSIS — C158 Malignant neoplasm of overlapping sites of esophagus: Secondary | ICD-10-CM | POA: Diagnosis not present

## 2019-10-22 DIAGNOSIS — G609 Hereditary and idiopathic neuropathy, unspecified: Secondary | ICD-10-CM | POA: Diagnosis not present

## 2019-10-22 DIAGNOSIS — C159 Malignant neoplasm of esophagus, unspecified: Secondary | ICD-10-CM | POA: Diagnosis not present

## 2019-10-22 DIAGNOSIS — Z483 Aftercare following surgery for neoplasm: Secondary | ICD-10-CM | POA: Diagnosis not present

## 2019-10-22 DIAGNOSIS — K219 Gastro-esophageal reflux disease without esophagitis: Secondary | ICD-10-CM | POA: Diagnosis not present

## 2019-10-22 DIAGNOSIS — L299 Pruritus, unspecified: Secondary | ICD-10-CM | POA: Diagnosis not present

## 2019-10-22 DIAGNOSIS — Z87891 Personal history of nicotine dependence: Secondary | ICD-10-CM | POA: Diagnosis not present

## 2019-10-22 DIAGNOSIS — R1319 Other dysphagia: Secondary | ICD-10-CM | POA: Diagnosis not present

## 2019-10-22 DIAGNOSIS — Z431 Encounter for attention to gastrostomy: Secondary | ICD-10-CM | POA: Diagnosis not present

## 2019-10-22 DIAGNOSIS — D630 Anemia in neoplastic disease: Secondary | ICD-10-CM | POA: Diagnosis not present

## 2019-10-23 DIAGNOSIS — Z483 Aftercare following surgery for neoplasm: Secondary | ICD-10-CM | POA: Diagnosis not present

## 2019-10-23 DIAGNOSIS — K219 Gastro-esophageal reflux disease without esophagitis: Secondary | ICD-10-CM | POA: Diagnosis not present

## 2019-10-23 DIAGNOSIS — D630 Anemia in neoplastic disease: Secondary | ICD-10-CM | POA: Diagnosis not present

## 2019-10-23 DIAGNOSIS — R1319 Other dysphagia: Secondary | ICD-10-CM | POA: Diagnosis not present

## 2019-10-23 DIAGNOSIS — G609 Hereditary and idiopathic neuropathy, unspecified: Secondary | ICD-10-CM | POA: Diagnosis not present

## 2019-10-23 DIAGNOSIS — C159 Malignant neoplasm of esophagus, unspecified: Secondary | ICD-10-CM | POA: Diagnosis not present

## 2019-10-24 DIAGNOSIS — C169 Malignant neoplasm of stomach, unspecified: Secondary | ICD-10-CM | POA: Diagnosis not present

## 2019-10-25 DIAGNOSIS — R1319 Other dysphagia: Secondary | ICD-10-CM | POA: Diagnosis not present

## 2019-10-25 DIAGNOSIS — G609 Hereditary and idiopathic neuropathy, unspecified: Secondary | ICD-10-CM | POA: Diagnosis not present

## 2019-10-25 DIAGNOSIS — Z483 Aftercare following surgery for neoplasm: Secondary | ICD-10-CM | POA: Diagnosis not present

## 2019-10-25 DIAGNOSIS — K219 Gastro-esophageal reflux disease without esophagitis: Secondary | ICD-10-CM | POA: Diagnosis not present

## 2019-10-25 DIAGNOSIS — C159 Malignant neoplasm of esophagus, unspecified: Secondary | ICD-10-CM | POA: Diagnosis not present

## 2019-10-25 DIAGNOSIS — D630 Anemia in neoplastic disease: Secondary | ICD-10-CM | POA: Diagnosis not present

## 2019-10-30 DIAGNOSIS — Z483 Aftercare following surgery for neoplasm: Secondary | ICD-10-CM | POA: Diagnosis not present

## 2019-10-30 DIAGNOSIS — R1319 Other dysphagia: Secondary | ICD-10-CM | POA: Diagnosis not present

## 2019-10-30 DIAGNOSIS — G609 Hereditary and idiopathic neuropathy, unspecified: Secondary | ICD-10-CM | POA: Diagnosis not present

## 2019-10-30 DIAGNOSIS — C159 Malignant neoplasm of esophagus, unspecified: Secondary | ICD-10-CM | POA: Diagnosis not present

## 2019-10-30 DIAGNOSIS — D630 Anemia in neoplastic disease: Secondary | ICD-10-CM | POA: Diagnosis not present

## 2019-10-30 DIAGNOSIS — K219 Gastro-esophageal reflux disease without esophagitis: Secondary | ICD-10-CM | POA: Diagnosis not present

## 2019-10-31 DIAGNOSIS — R1319 Other dysphagia: Secondary | ICD-10-CM | POA: Diagnosis not present

## 2019-10-31 DIAGNOSIS — C159 Malignant neoplasm of esophagus, unspecified: Secondary | ICD-10-CM | POA: Diagnosis not present

## 2019-10-31 DIAGNOSIS — Z483 Aftercare following surgery for neoplasm: Secondary | ICD-10-CM | POA: Diagnosis not present

## 2019-10-31 DIAGNOSIS — G609 Hereditary and idiopathic neuropathy, unspecified: Secondary | ICD-10-CM | POA: Diagnosis not present

## 2019-10-31 DIAGNOSIS — D630 Anemia in neoplastic disease: Secondary | ICD-10-CM | POA: Diagnosis not present

## 2019-10-31 DIAGNOSIS — K219 Gastro-esophageal reflux disease without esophagitis: Secondary | ICD-10-CM | POA: Diagnosis not present

## 2019-11-01 DIAGNOSIS — G629 Polyneuropathy, unspecified: Secondary | ICD-10-CM | POA: Diagnosis not present

## 2019-11-01 DIAGNOSIS — F4323 Adjustment disorder with mixed anxiety and depressed mood: Secondary | ICD-10-CM | POA: Diagnosis not present

## 2019-11-01 DIAGNOSIS — F419 Anxiety disorder, unspecified: Secondary | ICD-10-CM | POA: Diagnosis not present

## 2019-11-01 DIAGNOSIS — C158 Malignant neoplasm of overlapping sites of esophagus: Secondary | ICD-10-CM | POA: Diagnosis not present

## 2019-11-01 DIAGNOSIS — Z931 Gastrostomy status: Secondary | ICD-10-CM | POA: Diagnosis not present

## 2019-11-01 DIAGNOSIS — G893 Neoplasm related pain (acute) (chronic): Secondary | ICD-10-CM | POA: Diagnosis not present

## 2019-11-07 DIAGNOSIS — K219 Gastro-esophageal reflux disease without esophagitis: Secondary | ICD-10-CM | POA: Diagnosis not present

## 2019-11-07 DIAGNOSIS — Z483 Aftercare following surgery for neoplasm: Secondary | ICD-10-CM | POA: Diagnosis not present

## 2019-11-07 DIAGNOSIS — G609 Hereditary and idiopathic neuropathy, unspecified: Secondary | ICD-10-CM | POA: Diagnosis not present

## 2019-11-07 DIAGNOSIS — C159 Malignant neoplasm of esophagus, unspecified: Secondary | ICD-10-CM | POA: Diagnosis not present

## 2019-11-07 DIAGNOSIS — D630 Anemia in neoplastic disease: Secondary | ICD-10-CM | POA: Diagnosis not present

## 2019-11-07 DIAGNOSIS — R1319 Other dysphagia: Secondary | ICD-10-CM | POA: Diagnosis not present

## 2019-11-14 DIAGNOSIS — Z483 Aftercare following surgery for neoplasm: Secondary | ICD-10-CM | POA: Diagnosis not present

## 2019-11-14 DIAGNOSIS — D630 Anemia in neoplastic disease: Secondary | ICD-10-CM | POA: Diagnosis not present

## 2019-11-14 DIAGNOSIS — C159 Malignant neoplasm of esophagus, unspecified: Secondary | ICD-10-CM | POA: Diagnosis not present

## 2019-11-14 DIAGNOSIS — G609 Hereditary and idiopathic neuropathy, unspecified: Secondary | ICD-10-CM | POA: Diagnosis not present

## 2019-11-14 DIAGNOSIS — R1319 Other dysphagia: Secondary | ICD-10-CM | POA: Diagnosis not present

## 2019-11-14 DIAGNOSIS — K219 Gastro-esophageal reflux disease without esophagitis: Secondary | ICD-10-CM | POA: Diagnosis not present

## 2019-11-15 DIAGNOSIS — C159 Malignant neoplasm of esophagus, unspecified: Secondary | ICD-10-CM | POA: Diagnosis not present

## 2019-11-15 DIAGNOSIS — R1319 Other dysphagia: Secondary | ICD-10-CM | POA: Diagnosis not present

## 2019-11-15 DIAGNOSIS — G609 Hereditary and idiopathic neuropathy, unspecified: Secondary | ICD-10-CM | POA: Diagnosis not present

## 2019-11-15 DIAGNOSIS — D630 Anemia in neoplastic disease: Secondary | ICD-10-CM | POA: Diagnosis not present

## 2019-11-15 DIAGNOSIS — K219 Gastro-esophageal reflux disease without esophagitis: Secondary | ICD-10-CM | POA: Diagnosis not present

## 2019-11-15 DIAGNOSIS — Z483 Aftercare following surgery for neoplasm: Secondary | ICD-10-CM | POA: Diagnosis not present

## 2019-11-16 DIAGNOSIS — K219 Gastro-esophageal reflux disease without esophagitis: Secondary | ICD-10-CM | POA: Diagnosis not present

## 2019-11-16 DIAGNOSIS — G609 Hereditary and idiopathic neuropathy, unspecified: Secondary | ICD-10-CM | POA: Diagnosis not present

## 2019-11-16 DIAGNOSIS — R1319 Other dysphagia: Secondary | ICD-10-CM | POA: Diagnosis not present

## 2019-11-16 DIAGNOSIS — Z483 Aftercare following surgery for neoplasm: Secondary | ICD-10-CM | POA: Diagnosis not present

## 2019-11-16 DIAGNOSIS — C159 Malignant neoplasm of esophagus, unspecified: Secondary | ICD-10-CM | POA: Diagnosis not present

## 2019-11-16 DIAGNOSIS — D630 Anemia in neoplastic disease: Secondary | ICD-10-CM | POA: Diagnosis not present

## 2019-11-19 DIAGNOSIS — G629 Polyneuropathy, unspecified: Secondary | ICD-10-CM | POA: Diagnosis not present

## 2019-11-19 DIAGNOSIS — C158 Malignant neoplasm of overlapping sites of esophagus: Secondary | ICD-10-CM | POA: Diagnosis not present

## 2019-11-20 DIAGNOSIS — Z483 Aftercare following surgery for neoplasm: Secondary | ICD-10-CM | POA: Diagnosis not present

## 2019-11-20 DIAGNOSIS — C159 Malignant neoplasm of esophagus, unspecified: Secondary | ICD-10-CM | POA: Diagnosis not present

## 2019-11-20 DIAGNOSIS — D630 Anemia in neoplastic disease: Secondary | ICD-10-CM | POA: Diagnosis not present

## 2019-11-20 DIAGNOSIS — R1319 Other dysphagia: Secondary | ICD-10-CM | POA: Diagnosis not present

## 2019-11-20 DIAGNOSIS — K219 Gastro-esophageal reflux disease without esophagitis: Secondary | ICD-10-CM | POA: Diagnosis not present

## 2019-11-20 DIAGNOSIS — G609 Hereditary and idiopathic neuropathy, unspecified: Secondary | ICD-10-CM | POA: Diagnosis not present

## 2019-11-21 DIAGNOSIS — R1319 Other dysphagia: Secondary | ICD-10-CM | POA: Diagnosis not present

## 2019-11-21 DIAGNOSIS — G609 Hereditary and idiopathic neuropathy, unspecified: Secondary | ICD-10-CM | POA: Diagnosis not present

## 2019-11-21 DIAGNOSIS — Z87891 Personal history of nicotine dependence: Secondary | ICD-10-CM | POA: Diagnosis not present

## 2019-11-21 DIAGNOSIS — K219 Gastro-esophageal reflux disease without esophagitis: Secondary | ICD-10-CM | POA: Diagnosis not present

## 2019-11-21 DIAGNOSIS — Z483 Aftercare following surgery for neoplasm: Secondary | ICD-10-CM | POA: Diagnosis not present

## 2019-11-21 DIAGNOSIS — L299 Pruritus, unspecified: Secondary | ICD-10-CM | POA: Diagnosis not present

## 2019-11-21 DIAGNOSIS — C159 Malignant neoplasm of esophagus, unspecified: Secondary | ICD-10-CM | POA: Diagnosis not present

## 2019-11-21 DIAGNOSIS — Z431 Encounter for attention to gastrostomy: Secondary | ICD-10-CM | POA: Diagnosis not present

## 2019-11-21 DIAGNOSIS — D630 Anemia in neoplastic disease: Secondary | ICD-10-CM | POA: Diagnosis not present

## 2019-11-22 DIAGNOSIS — C159 Malignant neoplasm of esophagus, unspecified: Secondary | ICD-10-CM | POA: Diagnosis not present

## 2019-11-22 DIAGNOSIS — R1319 Other dysphagia: Secondary | ICD-10-CM | POA: Diagnosis not present

## 2019-11-22 DIAGNOSIS — Z483 Aftercare following surgery for neoplasm: Secondary | ICD-10-CM | POA: Diagnosis not present

## 2019-11-22 DIAGNOSIS — K219 Gastro-esophageal reflux disease without esophagitis: Secondary | ICD-10-CM | POA: Diagnosis not present

## 2019-11-22 DIAGNOSIS — D630 Anemia in neoplastic disease: Secondary | ICD-10-CM | POA: Diagnosis not present

## 2019-11-22 DIAGNOSIS — G609 Hereditary and idiopathic neuropathy, unspecified: Secondary | ICD-10-CM | POA: Diagnosis not present

## 2019-11-26 DIAGNOSIS — G609 Hereditary and idiopathic neuropathy, unspecified: Secondary | ICD-10-CM | POA: Diagnosis not present

## 2019-11-26 DIAGNOSIS — D630 Anemia in neoplastic disease: Secondary | ICD-10-CM | POA: Diagnosis not present

## 2019-11-26 DIAGNOSIS — R1319 Other dysphagia: Secondary | ICD-10-CM | POA: Diagnosis not present

## 2019-11-26 DIAGNOSIS — C159 Malignant neoplasm of esophagus, unspecified: Secondary | ICD-10-CM | POA: Diagnosis not present

## 2019-11-26 DIAGNOSIS — K219 Gastro-esophageal reflux disease without esophagitis: Secondary | ICD-10-CM | POA: Diagnosis not present

## 2019-11-26 DIAGNOSIS — Z483 Aftercare following surgery for neoplasm: Secondary | ICD-10-CM | POA: Diagnosis not present

## 2019-11-27 DIAGNOSIS — R188 Other ascites: Secondary | ICD-10-CM | POA: Diagnosis not present

## 2019-11-27 DIAGNOSIS — C169 Malignant neoplasm of stomach, unspecified: Secondary | ICD-10-CM | POA: Diagnosis not present

## 2019-11-27 DIAGNOSIS — Z01812 Encounter for preprocedural laboratory examination: Secondary | ICD-10-CM | POA: Diagnosis not present

## 2019-11-27 DIAGNOSIS — C158 Malignant neoplasm of overlapping sites of esophagus: Secondary | ICD-10-CM | POA: Diagnosis not present

## 2019-11-28 DIAGNOSIS — K219 Gastro-esophageal reflux disease without esophagitis: Secondary | ICD-10-CM | POA: Diagnosis not present

## 2019-11-28 DIAGNOSIS — Z483 Aftercare following surgery for neoplasm: Secondary | ICD-10-CM | POA: Diagnosis not present

## 2019-11-28 DIAGNOSIS — R1319 Other dysphagia: Secondary | ICD-10-CM | POA: Diagnosis not present

## 2019-11-28 DIAGNOSIS — G609 Hereditary and idiopathic neuropathy, unspecified: Secondary | ICD-10-CM | POA: Diagnosis not present

## 2019-11-28 DIAGNOSIS — D630 Anemia in neoplastic disease: Secondary | ICD-10-CM | POA: Diagnosis not present

## 2019-11-28 DIAGNOSIS — C159 Malignant neoplasm of esophagus, unspecified: Secondary | ICD-10-CM | POA: Diagnosis not present

## 2019-11-29 DIAGNOSIS — R1319 Other dysphagia: Secondary | ICD-10-CM | POA: Diagnosis not present

## 2019-11-29 DIAGNOSIS — K219 Gastro-esophageal reflux disease without esophagitis: Secondary | ICD-10-CM | POA: Diagnosis not present

## 2019-11-29 DIAGNOSIS — G609 Hereditary and idiopathic neuropathy, unspecified: Secondary | ICD-10-CM | POA: Diagnosis not present

## 2019-11-29 DIAGNOSIS — Z483 Aftercare following surgery for neoplasm: Secondary | ICD-10-CM | POA: Diagnosis not present

## 2019-11-29 DIAGNOSIS — D630 Anemia in neoplastic disease: Secondary | ICD-10-CM | POA: Diagnosis not present

## 2019-11-29 DIAGNOSIS — C159 Malignant neoplasm of esophagus, unspecified: Secondary | ICD-10-CM | POA: Diagnosis not present

## 2019-12-12 DIAGNOSIS — C159 Malignant neoplasm of esophagus, unspecified: Secondary | ICD-10-CM | POA: Diagnosis not present

## 2019-12-12 DIAGNOSIS — Z483 Aftercare following surgery for neoplasm: Secondary | ICD-10-CM | POA: Diagnosis not present

## 2019-12-12 DIAGNOSIS — D630 Anemia in neoplastic disease: Secondary | ICD-10-CM | POA: Diagnosis not present

## 2019-12-12 DIAGNOSIS — R1319 Other dysphagia: Secondary | ICD-10-CM | POA: Diagnosis not present

## 2019-12-12 DIAGNOSIS — G609 Hereditary and idiopathic neuropathy, unspecified: Secondary | ICD-10-CM | POA: Diagnosis not present

## 2019-12-12 DIAGNOSIS — K219 Gastro-esophageal reflux disease without esophagitis: Secondary | ICD-10-CM | POA: Diagnosis not present

## 2019-12-19 DIAGNOSIS — C159 Malignant neoplasm of esophagus, unspecified: Secondary | ICD-10-CM | POA: Diagnosis not present

## 2019-12-19 DIAGNOSIS — R1319 Other dysphagia: Secondary | ICD-10-CM | POA: Diagnosis not present

## 2019-12-19 DIAGNOSIS — K219 Gastro-esophageal reflux disease without esophagitis: Secondary | ICD-10-CM | POA: Diagnosis not present

## 2019-12-19 DIAGNOSIS — D630 Anemia in neoplastic disease: Secondary | ICD-10-CM | POA: Diagnosis not present

## 2019-12-19 DIAGNOSIS — G609 Hereditary and idiopathic neuropathy, unspecified: Secondary | ICD-10-CM | POA: Diagnosis not present

## 2019-12-19 DIAGNOSIS — Z483 Aftercare following surgery for neoplasm: Secondary | ICD-10-CM | POA: Diagnosis not present

## 2019-12-24 DIAGNOSIS — C786 Secondary malignant neoplasm of retroperitoneum and peritoneum: Secondary | ICD-10-CM | POA: Diagnosis not present

## 2019-12-24 DIAGNOSIS — Z79899 Other long term (current) drug therapy: Secondary | ICD-10-CM | POA: Diagnosis not present

## 2019-12-24 DIAGNOSIS — R188 Other ascites: Secondary | ICD-10-CM | POA: Diagnosis not present

## 2019-12-24 DIAGNOSIS — R1319 Other dysphagia: Secondary | ICD-10-CM | POA: Diagnosis not present

## 2019-12-24 DIAGNOSIS — K219 Gastro-esophageal reflux disease without esophagitis: Secondary | ICD-10-CM | POA: Diagnosis not present

## 2019-12-24 DIAGNOSIS — Z931 Gastrostomy status: Secondary | ICD-10-CM | POA: Diagnosis not present

## 2019-12-24 DIAGNOSIS — F32A Depression, unspecified: Secondary | ICD-10-CM | POA: Diagnosis not present

## 2019-12-24 DIAGNOSIS — G629 Polyneuropathy, unspecified: Secondary | ICD-10-CM | POA: Diagnosis not present

## 2019-12-24 DIAGNOSIS — G893 Neoplasm related pain (acute) (chronic): Secondary | ICD-10-CM | POA: Diagnosis not present

## 2019-12-24 DIAGNOSIS — C158 Malignant neoplasm of overlapping sites of esophagus: Secondary | ICD-10-CM | POA: Diagnosis not present

## 2019-12-24 DIAGNOSIS — Z87891 Personal history of nicotine dependence: Secondary | ICD-10-CM | POA: Diagnosis not present

## 2019-12-27 ENCOUNTER — Encounter: Payer: Self-pay | Admitting: Podiatry

## 2019-12-27 ENCOUNTER — Ambulatory Visit (INDEPENDENT_AMBULATORY_CARE_PROVIDER_SITE_OTHER): Payer: Medicare Other | Admitting: Podiatry

## 2019-12-27 ENCOUNTER — Other Ambulatory Visit: Payer: Self-pay

## 2019-12-27 DIAGNOSIS — B351 Tinea unguium: Secondary | ICD-10-CM

## 2019-12-27 DIAGNOSIS — M79675 Pain in left toe(s): Secondary | ICD-10-CM | POA: Diagnosis not present

## 2019-12-27 DIAGNOSIS — M79674 Pain in right toe(s): Secondary | ICD-10-CM

## 2019-12-27 NOTE — Progress Notes (Signed)
Subjective:   Patient ID: Jodi Miller, female   DOB: 85 y.o.   MRN: [id number]   HPI Patient states she has stomach cancer and cannot cut her own toenails   ROS      Objective:  Physical Exam  Neurovascular status intact with incurvated nailbeds 1-5 both feet with patient who is very sensitive and has discomfort secondary to previous radiation and mycotic nail infection     Assessment:  Mycotic nail infection 1-5 both feet with pain     Plan:  Debridement nailbeds 1-5 both feet no iatrogenic bleeding reappoint routine care

## 2019-12-31 DIAGNOSIS — R1310 Dysphagia, unspecified: Secondary | ICD-10-CM | POA: Diagnosis not present

## 2019-12-31 DIAGNOSIS — F329 Major depressive disorder, single episode, unspecified: Secondary | ICD-10-CM | POA: Diagnosis not present

## 2019-12-31 DIAGNOSIS — D649 Anemia, unspecified: Secondary | ICD-10-CM | POA: Diagnosis not present

## 2019-12-31 DIAGNOSIS — C169 Malignant neoplasm of stomach, unspecified: Secondary | ICD-10-CM | POA: Diagnosis not present

## 2019-12-31 DIAGNOSIS — Z0000 Encounter for general adult medical examination without abnormal findings: Secondary | ICD-10-CM | POA: Diagnosis not present

## 2020-01-17 DIAGNOSIS — G629 Polyneuropathy, unspecified: Secondary | ICD-10-CM | POA: Diagnosis not present

## 2020-01-17 DIAGNOSIS — C158 Malignant neoplasm of overlapping sites of esophagus: Secondary | ICD-10-CM | POA: Diagnosis not present

## 2020-01-17 DIAGNOSIS — K219 Gastro-esophageal reflux disease without esophagitis: Secondary | ICD-10-CM | POA: Diagnosis not present

## 2020-01-17 DIAGNOSIS — C169 Malignant neoplasm of stomach, unspecified: Secondary | ICD-10-CM | POA: Diagnosis not present

## 2020-01-17 DIAGNOSIS — F329 Major depressive disorder, single episode, unspecified: Secondary | ICD-10-CM | POA: Diagnosis not present

## 2020-01-17 DIAGNOSIS — Z9011 Acquired absence of right breast and nipple: Secondary | ICD-10-CM | POA: Diagnosis not present

## 2020-01-17 DIAGNOSIS — R1319 Other dysphagia: Secondary | ICD-10-CM | POA: Diagnosis not present

## 2020-01-17 DIAGNOSIS — G893 Neoplasm related pain (acute) (chronic): Secondary | ICD-10-CM | POA: Diagnosis not present

## 2020-01-17 DIAGNOSIS — Z931 Gastrostomy status: Secondary | ICD-10-CM | POA: Diagnosis not present

## 2020-01-17 DIAGNOSIS — Z66 Do not resuscitate: Secondary | ICD-10-CM | POA: Diagnosis not present

## 2020-01-19 DIAGNOSIS — G893 Neoplasm related pain (acute) (chronic): Secondary | ICD-10-CM | POA: Diagnosis not present

## 2020-01-19 DIAGNOSIS — K219 Gastro-esophageal reflux disease without esophagitis: Secondary | ICD-10-CM | POA: Diagnosis not present

## 2020-01-19 DIAGNOSIS — Z66 Do not resuscitate: Secondary | ICD-10-CM | POA: Diagnosis not present

## 2020-01-19 DIAGNOSIS — C169 Malignant neoplasm of stomach, unspecified: Secondary | ICD-10-CM | POA: Diagnosis not present

## 2020-01-19 DIAGNOSIS — F329 Major depressive disorder, single episode, unspecified: Secondary | ICD-10-CM | POA: Diagnosis not present

## 2020-01-19 DIAGNOSIS — C158 Malignant neoplasm of overlapping sites of esophagus: Secondary | ICD-10-CM | POA: Diagnosis not present

## 2020-01-19 DIAGNOSIS — Z9011 Acquired absence of right breast and nipple: Secondary | ICD-10-CM | POA: Diagnosis not present

## 2020-01-19 DIAGNOSIS — R1319 Other dysphagia: Secondary | ICD-10-CM | POA: Diagnosis not present

## 2020-01-19 DIAGNOSIS — G629 Polyneuropathy, unspecified: Secondary | ICD-10-CM | POA: Diagnosis not present

## 2020-01-19 DIAGNOSIS — Z931 Gastrostomy status: Secondary | ICD-10-CM | POA: Diagnosis not present

## 2020-01-22 DIAGNOSIS — Z931 Gastrostomy status: Secondary | ICD-10-CM | POA: Diagnosis not present

## 2020-01-22 DIAGNOSIS — C169 Malignant neoplasm of stomach, unspecified: Secondary | ICD-10-CM | POA: Diagnosis not present

## 2020-01-22 DIAGNOSIS — C158 Malignant neoplasm of overlapping sites of esophagus: Secondary | ICD-10-CM | POA: Diagnosis not present

## 2020-01-22 DIAGNOSIS — R1319 Other dysphagia: Secondary | ICD-10-CM | POA: Diagnosis not present

## 2020-01-22 DIAGNOSIS — G893 Neoplasm related pain (acute) (chronic): Secondary | ICD-10-CM | POA: Diagnosis not present

## 2020-01-22 DIAGNOSIS — G629 Polyneuropathy, unspecified: Secondary | ICD-10-CM | POA: Diagnosis not present

## 2020-01-23 DIAGNOSIS — G893 Neoplasm related pain (acute) (chronic): Secondary | ICD-10-CM | POA: Diagnosis not present

## 2020-01-23 DIAGNOSIS — Z931 Gastrostomy status: Secondary | ICD-10-CM | POA: Diagnosis not present

## 2020-01-23 DIAGNOSIS — R1319 Other dysphagia: Secondary | ICD-10-CM | POA: Diagnosis not present

## 2020-01-23 DIAGNOSIS — C169 Malignant neoplasm of stomach, unspecified: Secondary | ICD-10-CM | POA: Diagnosis not present

## 2020-01-23 DIAGNOSIS — C158 Malignant neoplasm of overlapping sites of esophagus: Secondary | ICD-10-CM | POA: Diagnosis not present

## 2020-01-23 DIAGNOSIS — G629 Polyneuropathy, unspecified: Secondary | ICD-10-CM | POA: Diagnosis not present

## 2020-01-25 DIAGNOSIS — Z931 Gastrostomy status: Secondary | ICD-10-CM | POA: Diagnosis not present

## 2020-01-25 DIAGNOSIS — G629 Polyneuropathy, unspecified: Secondary | ICD-10-CM | POA: Diagnosis not present

## 2020-01-25 DIAGNOSIS — G893 Neoplasm related pain (acute) (chronic): Secondary | ICD-10-CM | POA: Diagnosis not present

## 2020-01-25 DIAGNOSIS — C169 Malignant neoplasm of stomach, unspecified: Secondary | ICD-10-CM | POA: Diagnosis not present

## 2020-01-25 DIAGNOSIS — C158 Malignant neoplasm of overlapping sites of esophagus: Secondary | ICD-10-CM | POA: Diagnosis not present

## 2020-01-25 DIAGNOSIS — R1319 Other dysphagia: Secondary | ICD-10-CM | POA: Diagnosis not present

## 2020-01-26 DIAGNOSIS — G629 Polyneuropathy, unspecified: Secondary | ICD-10-CM | POA: Diagnosis not present

## 2020-01-26 DIAGNOSIS — G893 Neoplasm related pain (acute) (chronic): Secondary | ICD-10-CM | POA: Diagnosis not present

## 2020-01-26 DIAGNOSIS — Z931 Gastrostomy status: Secondary | ICD-10-CM | POA: Diagnosis not present

## 2020-01-26 DIAGNOSIS — R1319 Other dysphagia: Secondary | ICD-10-CM | POA: Diagnosis not present

## 2020-01-26 DIAGNOSIS — C169 Malignant neoplasm of stomach, unspecified: Secondary | ICD-10-CM | POA: Diagnosis not present

## 2020-01-26 DIAGNOSIS — C158 Malignant neoplasm of overlapping sites of esophagus: Secondary | ICD-10-CM | POA: Diagnosis not present

## 2020-01-28 DIAGNOSIS — G893 Neoplasm related pain (acute) (chronic): Secondary | ICD-10-CM | POA: Diagnosis not present

## 2020-01-28 DIAGNOSIS — C169 Malignant neoplasm of stomach, unspecified: Secondary | ICD-10-CM | POA: Diagnosis not present

## 2020-01-28 DIAGNOSIS — R1319 Other dysphagia: Secondary | ICD-10-CM | POA: Diagnosis not present

## 2020-01-28 DIAGNOSIS — G629 Polyneuropathy, unspecified: Secondary | ICD-10-CM | POA: Diagnosis not present

## 2020-01-28 DIAGNOSIS — Z931 Gastrostomy status: Secondary | ICD-10-CM | POA: Diagnosis not present

## 2020-01-28 DIAGNOSIS — C158 Malignant neoplasm of overlapping sites of esophagus: Secondary | ICD-10-CM | POA: Diagnosis not present

## 2020-01-30 ENCOUNTER — Other Ambulatory Visit: Payer: Self-pay | Admitting: Hematology and Oncology

## 2020-01-30 ENCOUNTER — Other Ambulatory Visit (HOSPITAL_COMMUNITY): Payer: Self-pay | Admitting: Hematology and Oncology

## 2020-01-30 DIAGNOSIS — R1319 Other dysphagia: Secondary | ICD-10-CM | POA: Diagnosis not present

## 2020-01-30 DIAGNOSIS — G893 Neoplasm related pain (acute) (chronic): Secondary | ICD-10-CM | POA: Diagnosis not present

## 2020-01-30 DIAGNOSIS — C158 Malignant neoplasm of overlapping sites of esophagus: Secondary | ICD-10-CM

## 2020-01-30 DIAGNOSIS — Z931 Gastrostomy status: Secondary | ICD-10-CM | POA: Diagnosis not present

## 2020-01-30 DIAGNOSIS — C169 Malignant neoplasm of stomach, unspecified: Secondary | ICD-10-CM | POA: Diagnosis not present

## 2020-01-30 DIAGNOSIS — G629 Polyneuropathy, unspecified: Secondary | ICD-10-CM | POA: Diagnosis not present

## 2020-01-30 DIAGNOSIS — R188 Other ascites: Secondary | ICD-10-CM

## 2020-01-31 ENCOUNTER — Other Ambulatory Visit: Payer: Self-pay

## 2020-01-31 ENCOUNTER — Ambulatory Visit (HOSPITAL_COMMUNITY)
Admission: RE | Admit: 2020-01-31 | Discharge: 2020-01-31 | Disposition: A | Discharge status: Home / Self Care | Payer: Medicare Other | Source: Ambulatory Visit | Attending: Hematology and Oncology | Admitting: Hematology and Oncology

## 2020-01-31 DIAGNOSIS — R188 Other ascites: Secondary | ICD-10-CM | POA: Diagnosis not present

## 2020-01-31 DIAGNOSIS — C158 Malignant neoplasm of overlapping sites of esophagus: Secondary | ICD-10-CM

## 2020-01-31 HISTORY — PX: IR PARACENTESIS: IMG2679

## 2020-01-31 MED ORDER — LIDOCAINE HCL (PF) 1 % IJ SOLN
INTRAMUSCULAR | Status: DC | PRN
  Administered 2020-01-31: 10 mL

## 2020-01-31 MED ORDER — LIDOCAINE HCL 1 % IJ SOLN
INTRAMUSCULAR | Status: AC
  Filled 2020-01-31: qty 20

## 2020-01-31 NOTE — Procedures (Signed)
Ultrasound-guided  therapeutic paracentesis performed yielding 6.4  liters of slightly hazy, yellow fluid. No immediate complications. EBL none.

## 2020-02-01 DIAGNOSIS — R1319 Other dysphagia: Secondary | ICD-10-CM | POA: Diagnosis not present

## 2020-02-01 DIAGNOSIS — C159 Malignant neoplasm of esophagus, unspecified: Secondary | ICD-10-CM | POA: Diagnosis not present

## 2020-02-01 DIAGNOSIS — Z931 Gastrostomy status: Secondary | ICD-10-CM | POA: Diagnosis not present

## 2020-02-01 DIAGNOSIS — C158 Malignant neoplasm of overlapping sites of esophagus: Secondary | ICD-10-CM | POA: Diagnosis not present

## 2020-02-01 DIAGNOSIS — R180 Malignant ascites: Secondary | ICD-10-CM | POA: Diagnosis not present

## 2020-02-01 DIAGNOSIS — G629 Polyneuropathy, unspecified: Secondary | ICD-10-CM | POA: Diagnosis not present

## 2020-02-01 DIAGNOSIS — G893 Neoplasm related pain (acute) (chronic): Secondary | ICD-10-CM | POA: Diagnosis not present

## 2020-02-01 DIAGNOSIS — C169 Malignant neoplasm of stomach, unspecified: Secondary | ICD-10-CM | POA: Diagnosis not present

## 2020-02-05 DIAGNOSIS — C169 Malignant neoplasm of stomach, unspecified: Secondary | ICD-10-CM | POA: Diagnosis not present

## 2020-02-05 DIAGNOSIS — Z931 Gastrostomy status: Secondary | ICD-10-CM | POA: Diagnosis not present

## 2020-02-05 DIAGNOSIS — G893 Neoplasm related pain (acute) (chronic): Secondary | ICD-10-CM | POA: Diagnosis not present

## 2020-02-05 DIAGNOSIS — R1319 Other dysphagia: Secondary | ICD-10-CM | POA: Diagnosis not present

## 2020-02-05 DIAGNOSIS — G629 Polyneuropathy, unspecified: Secondary | ICD-10-CM | POA: Diagnosis not present

## 2020-02-05 DIAGNOSIS — C158 Malignant neoplasm of overlapping sites of esophagus: Secondary | ICD-10-CM | POA: Diagnosis not present

## 2020-02-06 DIAGNOSIS — Z931 Gastrostomy status: Secondary | ICD-10-CM | POA: Diagnosis not present

## 2020-02-06 DIAGNOSIS — R1319 Other dysphagia: Secondary | ICD-10-CM | POA: Diagnosis not present

## 2020-02-06 DIAGNOSIS — G629 Polyneuropathy, unspecified: Secondary | ICD-10-CM | POA: Diagnosis not present

## 2020-02-06 DIAGNOSIS — C169 Malignant neoplasm of stomach, unspecified: Secondary | ICD-10-CM | POA: Diagnosis not present

## 2020-02-06 DIAGNOSIS — C158 Malignant neoplasm of overlapping sites of esophagus: Secondary | ICD-10-CM | POA: Diagnosis not present

## 2020-02-06 DIAGNOSIS — G893 Neoplasm related pain (acute) (chronic): Secondary | ICD-10-CM | POA: Diagnosis not present

## 2020-02-08 DIAGNOSIS — C158 Malignant neoplasm of overlapping sites of esophagus: Secondary | ICD-10-CM | POA: Diagnosis not present

## 2020-02-08 DIAGNOSIS — C169 Malignant neoplasm of stomach, unspecified: Secondary | ICD-10-CM | POA: Diagnosis not present

## 2020-02-08 DIAGNOSIS — R1319 Other dysphagia: Secondary | ICD-10-CM | POA: Diagnosis not present

## 2020-02-08 DIAGNOSIS — G629 Polyneuropathy, unspecified: Secondary | ICD-10-CM | POA: Diagnosis not present

## 2020-02-08 DIAGNOSIS — G893 Neoplasm related pain (acute) (chronic): Secondary | ICD-10-CM | POA: Diagnosis not present

## 2020-02-08 DIAGNOSIS — Z931 Gastrostomy status: Secondary | ICD-10-CM | POA: Diagnosis not present

## 2020-02-09 DIAGNOSIS — G629 Polyneuropathy, unspecified: Secondary | ICD-10-CM | POA: Diagnosis not present

## 2020-02-09 DIAGNOSIS — Z931 Gastrostomy status: Secondary | ICD-10-CM | POA: Diagnosis not present

## 2020-02-09 DIAGNOSIS — G893 Neoplasm related pain (acute) (chronic): Secondary | ICD-10-CM | POA: Diagnosis not present

## 2020-02-09 DIAGNOSIS — C158 Malignant neoplasm of overlapping sites of esophagus: Secondary | ICD-10-CM | POA: Diagnosis not present

## 2020-02-09 DIAGNOSIS — C169 Malignant neoplasm of stomach, unspecified: Secondary | ICD-10-CM | POA: Diagnosis not present

## 2020-02-09 DIAGNOSIS — R1319 Other dysphagia: Secondary | ICD-10-CM | POA: Diagnosis not present

## 2020-02-12 DIAGNOSIS — Z931 Gastrostomy status: Secondary | ICD-10-CM | POA: Diagnosis not present

## 2020-02-12 DIAGNOSIS — C169 Malignant neoplasm of stomach, unspecified: Secondary | ICD-10-CM | POA: Diagnosis not present

## 2020-02-12 DIAGNOSIS — R1319 Other dysphagia: Secondary | ICD-10-CM | POA: Diagnosis not present

## 2020-02-12 DIAGNOSIS — G629 Polyneuropathy, unspecified: Secondary | ICD-10-CM | POA: Diagnosis not present

## 2020-02-12 DIAGNOSIS — C158 Malignant neoplasm of overlapping sites of esophagus: Secondary | ICD-10-CM | POA: Diagnosis not present

## 2020-02-12 DIAGNOSIS — G893 Neoplasm related pain (acute) (chronic): Secondary | ICD-10-CM | POA: Diagnosis not present

## 2020-02-13 DIAGNOSIS — Z931 Gastrostomy status: Secondary | ICD-10-CM | POA: Diagnosis not present

## 2020-02-13 DIAGNOSIS — G893 Neoplasm related pain (acute) (chronic): Secondary | ICD-10-CM | POA: Diagnosis not present

## 2020-02-13 DIAGNOSIS — R1319 Other dysphagia: Secondary | ICD-10-CM | POA: Diagnosis not present

## 2020-02-13 DIAGNOSIS — C158 Malignant neoplasm of overlapping sites of esophagus: Secondary | ICD-10-CM | POA: Diagnosis not present

## 2020-02-13 DIAGNOSIS — G629 Polyneuropathy, unspecified: Secondary | ICD-10-CM | POA: Diagnosis not present

## 2020-02-13 DIAGNOSIS — C169 Malignant neoplasm of stomach, unspecified: Secondary | ICD-10-CM | POA: Diagnosis not present

## 2020-02-15 DIAGNOSIS — C169 Malignant neoplasm of stomach, unspecified: Secondary | ICD-10-CM | POA: Diagnosis not present

## 2020-02-15 DIAGNOSIS — R1319 Other dysphagia: Secondary | ICD-10-CM | POA: Diagnosis not present

## 2020-02-15 DIAGNOSIS — G893 Neoplasm related pain (acute) (chronic): Secondary | ICD-10-CM | POA: Diagnosis not present

## 2020-02-15 DIAGNOSIS — C158 Malignant neoplasm of overlapping sites of esophagus: Secondary | ICD-10-CM | POA: Diagnosis not present

## 2020-02-15 DIAGNOSIS — G629 Polyneuropathy, unspecified: Secondary | ICD-10-CM | POA: Diagnosis not present

## 2020-02-15 DIAGNOSIS — Z931 Gastrostomy status: Secondary | ICD-10-CM | POA: Diagnosis not present

## 2020-02-18 DIAGNOSIS — Z931 Gastrostomy status: Secondary | ICD-10-CM | POA: Diagnosis not present

## 2020-02-18 DIAGNOSIS — G893 Neoplasm related pain (acute) (chronic): Secondary | ICD-10-CM | POA: Diagnosis not present

## 2020-02-18 DIAGNOSIS — G629 Polyneuropathy, unspecified: Secondary | ICD-10-CM | POA: Diagnosis not present

## 2020-02-18 DIAGNOSIS — R1319 Other dysphagia: Secondary | ICD-10-CM | POA: Diagnosis not present

## 2020-02-18 DIAGNOSIS — C158 Malignant neoplasm of overlapping sites of esophagus: Secondary | ICD-10-CM | POA: Diagnosis not present

## 2020-02-18 DIAGNOSIS — C169 Malignant neoplasm of stomach, unspecified: Secondary | ICD-10-CM | POA: Diagnosis not present

## 2020-02-19 DIAGNOSIS — K219 Gastro-esophageal reflux disease without esophagitis: Secondary | ICD-10-CM | POA: Diagnosis not present

## 2020-02-19 DIAGNOSIS — Z9011 Acquired absence of right breast and nipple: Secondary | ICD-10-CM | POA: Diagnosis not present

## 2020-02-19 DIAGNOSIS — C158 Malignant neoplasm of overlapping sites of esophagus: Secondary | ICD-10-CM | POA: Diagnosis not present

## 2020-02-19 DIAGNOSIS — Z66 Do not resuscitate: Secondary | ICD-10-CM | POA: Diagnosis not present

## 2020-02-19 DIAGNOSIS — C169 Malignant neoplasm of stomach, unspecified: Secondary | ICD-10-CM | POA: Diagnosis not present

## 2020-02-19 DIAGNOSIS — R1319 Other dysphagia: Secondary | ICD-10-CM | POA: Diagnosis not present

## 2020-02-19 DIAGNOSIS — G629 Polyneuropathy, unspecified: Secondary | ICD-10-CM | POA: Diagnosis not present

## 2020-02-19 DIAGNOSIS — Z931 Gastrostomy status: Secondary | ICD-10-CM | POA: Diagnosis not present

## 2020-02-19 DIAGNOSIS — F329 Major depressive disorder, single episode, unspecified: Secondary | ICD-10-CM | POA: Diagnosis not present

## 2020-02-19 DIAGNOSIS — G893 Neoplasm related pain (acute) (chronic): Secondary | ICD-10-CM | POA: Diagnosis not present

## 2020-02-20 DIAGNOSIS — C169 Malignant neoplasm of stomach, unspecified: Secondary | ICD-10-CM | POA: Diagnosis not present

## 2020-02-20 DIAGNOSIS — G629 Polyneuropathy, unspecified: Secondary | ICD-10-CM | POA: Diagnosis not present

## 2020-02-20 DIAGNOSIS — Z931 Gastrostomy status: Secondary | ICD-10-CM | POA: Diagnosis not present

## 2020-02-20 DIAGNOSIS — R1319 Other dysphagia: Secondary | ICD-10-CM | POA: Diagnosis not present

## 2020-02-20 DIAGNOSIS — G893 Neoplasm related pain (acute) (chronic): Secondary | ICD-10-CM | POA: Diagnosis not present

## 2020-02-20 DIAGNOSIS — C158 Malignant neoplasm of overlapping sites of esophagus: Secondary | ICD-10-CM | POA: Diagnosis not present

## 2020-02-21 ENCOUNTER — Other Ambulatory Visit (HOSPITAL_COMMUNITY): Payer: Self-pay | Admitting: Hematology and Oncology

## 2020-02-21 ENCOUNTER — Telehealth (HOSPITAL_COMMUNITY): Payer: Self-pay

## 2020-02-21 DIAGNOSIS — R1319 Other dysphagia: Secondary | ICD-10-CM | POA: Diagnosis not present

## 2020-02-21 DIAGNOSIS — C159 Malignant neoplasm of esophagus, unspecified: Secondary | ICD-10-CM

## 2020-02-21 DIAGNOSIS — C158 Malignant neoplasm of overlapping sites of esophagus: Secondary | ICD-10-CM | POA: Diagnosis not present

## 2020-02-21 DIAGNOSIS — Z931 Gastrostomy status: Secondary | ICD-10-CM | POA: Diagnosis not present

## 2020-02-21 DIAGNOSIS — G893 Neoplasm related pain (acute) (chronic): Secondary | ICD-10-CM | POA: Diagnosis not present

## 2020-02-21 DIAGNOSIS — R180 Malignant ascites: Secondary | ICD-10-CM

## 2020-02-21 DIAGNOSIS — G629 Polyneuropathy, unspecified: Secondary | ICD-10-CM | POA: Diagnosis not present

## 2020-02-21 DIAGNOSIS — C169 Malignant neoplasm of stomach, unspecified: Secondary | ICD-10-CM | POA: Diagnosis not present

## 2020-02-21 NOTE — Telephone Encounter (Signed)
-----   Message from Suzette Battiest, MD sent at 02/21/2020  2:23 PM EST ----- Regarding: RE: IR insert intraperitoneal catheter drain Approved for peritoneal pleurex placement.  Dylan ----- Message ----- From: Danielle Dess Sent: 02/21/2020  12:26 PM EST To: Ir Procedure Requests Subject: IR insert intraperitoneal catheter drain       Procedure: Insert intraperitoneal catheter drain  Dx: adenocarcinoma of esophagus, malignant ascites  Ordering: Caralee Ates, [facility] [phone] option #6)  Imaging: Para done 01/31/20 in epic  Please review.   Thanks,  Lia Foyer

## 2020-02-22 DIAGNOSIS — C169 Malignant neoplasm of stomach, unspecified: Secondary | ICD-10-CM | POA: Diagnosis not present

## 2020-02-22 DIAGNOSIS — G893 Neoplasm related pain (acute) (chronic): Secondary | ICD-10-CM | POA: Diagnosis not present

## 2020-02-22 DIAGNOSIS — R1319 Other dysphagia: Secondary | ICD-10-CM | POA: Diagnosis not present

## 2020-02-22 DIAGNOSIS — G629 Polyneuropathy, unspecified: Secondary | ICD-10-CM | POA: Diagnosis not present

## 2020-02-22 DIAGNOSIS — C158 Malignant neoplasm of overlapping sites of esophagus: Secondary | ICD-10-CM | POA: Diagnosis not present

## 2020-02-22 DIAGNOSIS — Z931 Gastrostomy status: Secondary | ICD-10-CM | POA: Diagnosis not present

## 2020-02-24 ENCOUNTER — Other Ambulatory Visit: Payer: Self-pay | Admitting: Student

## 2020-02-25 ENCOUNTER — Encounter (HOSPITAL_COMMUNITY): Payer: Self-pay

## 2020-02-25 ENCOUNTER — Other Ambulatory Visit: Payer: Self-pay

## 2020-02-25 ENCOUNTER — Ambulatory Visit (HOSPITAL_COMMUNITY)
Admission: RE | Admit: 2020-02-25 | Discharge: 2020-02-25 | Disposition: A | Discharge status: Home / Self Care | Source: Ambulatory Visit | Attending: Hematology and Oncology | Admitting: Hematology and Oncology

## 2020-02-25 ENCOUNTER — Other Ambulatory Visit (HOSPITAL_COMMUNITY): Payer: Self-pay | Admitting: Hematology and Oncology

## 2020-02-25 DIAGNOSIS — C169 Malignant neoplasm of stomach, unspecified: Secondary | ICD-10-CM | POA: Diagnosis not present

## 2020-02-25 DIAGNOSIS — Z87891 Personal history of nicotine dependence: Secondary | ICD-10-CM | POA: Diagnosis not present

## 2020-02-25 DIAGNOSIS — G629 Polyneuropathy, unspecified: Secondary | ICD-10-CM | POA: Diagnosis not present

## 2020-02-25 DIAGNOSIS — C159 Malignant neoplasm of esophagus, unspecified: Secondary | ICD-10-CM | POA: Insufficient documentation

## 2020-02-25 DIAGNOSIS — Z882 Allergy status to sulfonamides status: Secondary | ICD-10-CM | POA: Insufficient documentation

## 2020-02-25 DIAGNOSIS — Z79899 Other long term (current) drug therapy: Secondary | ICD-10-CM | POA: Insufficient documentation

## 2020-02-25 DIAGNOSIS — R180 Malignant ascites: Secondary | ICD-10-CM

## 2020-02-25 DIAGNOSIS — Z931 Gastrostomy status: Secondary | ICD-10-CM | POA: Diagnosis not present

## 2020-02-25 DIAGNOSIS — C158 Malignant neoplasm of overlapping sites of esophagus: Secondary | ICD-10-CM | POA: Diagnosis not present

## 2020-02-25 DIAGNOSIS — R1319 Other dysphagia: Secondary | ICD-10-CM | POA: Diagnosis not present

## 2020-02-25 DIAGNOSIS — G893 Neoplasm related pain (acute) (chronic): Secondary | ICD-10-CM | POA: Diagnosis not present

## 2020-02-25 HISTORY — PX: IR US GUIDE BX ASP/DRAIN: IMG2392

## 2020-02-25 HISTORY — PX: IR GUIDED DRAIN W CATHETER PLACEMENT: IMG719

## 2020-02-25 LAB — CBC
HCT: 34.8 % — ABNORMAL LOW (ref 36.0–46.0)
Hemoglobin: 10.6 g/dL — ABNORMAL LOW (ref 12.0–15.0)
MCH: 27.9 pg (ref 26.0–34.0)
MCHC: 30.5 g/dL (ref 30.0–36.0)
MCV: 91.6 fL (ref 80.0–100.0)
Platelets: 238 10*3/uL (ref 150–400)
RBC: 3.8 MIL/uL — ABNORMAL LOW (ref 3.87–5.11)
RDW: 14.6 % (ref 11.5–15.5)
WBC: 5.7 10*3/uL (ref 4.0–10.5)
nRBC: 0 % (ref 0.0–0.2)

## 2020-02-25 LAB — PROTIME-INR
INR: 1 (ref 0.8–1.2)
Prothrombin Time: 13.1 seconds (ref 11.4–15.2)

## 2020-02-25 MED ORDER — CEFAZOLIN SODIUM-DEXTROSE 2-4 GM/100ML-% IV SOLN: 2.0000 g | Freq: Once | INTRAVENOUS | Status: AC

## 2020-02-25 MED ORDER — MIDAZOLAM HCL 2 MG/2ML IJ SOLN
INTRAMUSCULAR | Status: AC
  Filled 2020-02-25: qty 2

## 2020-02-25 MED ORDER — SODIUM CHLORIDE 0.9 % IV SOLN
INTRAVENOUS | Status: AC | PRN
  Administered 2020-02-25: 10 mL/h via INTRAVENOUS

## 2020-02-25 MED ORDER — FENTANYL CITRATE (PF) 100 MCG/2ML IJ SOLN
INTRAMUSCULAR | Status: AC | PRN
  Administered 2020-02-25: 25 ug via INTRAVENOUS

## 2020-02-25 MED ORDER — CEFAZOLIN SODIUM-DEXTROSE 2-4 GM/100ML-% IV SOLN
INTRAVENOUS | Status: AC
  Administered 2020-02-25: 2 g via INTRAVENOUS
  Filled 2020-02-25: qty 100

## 2020-02-25 MED ORDER — FENTANYL CITRATE (PF) 100 MCG/2ML IJ SOLN
INTRAMUSCULAR | Status: AC
  Filled 2020-02-25: qty 2

## 2020-02-25 MED ORDER — SODIUM CHLORIDE 0.9 % IV SOLN: INTRAVENOUS | Status: DC

## 2020-02-25 MED ORDER — MIDAZOLAM HCL 2 MG/2ML IJ SOLN
INTRAMUSCULAR | Status: AC | PRN
  Administered 2020-02-25: 0.5 mg via INTRAVENOUS

## 2020-02-25 NOTE — H&P (Signed)
Chief Complaint: Patient was seen in consultation today for peritoneal PleurX catheter placement at the request of O'Neill,Margot Mahon  Referring Physician(s): O'Neill,Margot Mahon Dr Fanny Skates-- Oncology [facility]  Supervising Physician: Sandi Mariscal  Patient Status: [facility] - Out-pt  History of Present Illness: Jodi Miller is a 86 y.o. female   Esophageal cancer Recurrent malignant ascites Paracentesis 1/13 in IR 6.4L  Follows with [facility] Dr Fanny Skates and Jacqulyn Ducking PA Also is in [place]- 24 hr/7 days a week care Along with 3x/week Hospice RN per Daughter Bennie Dallas via phone  Scheduled today for Peritoneal PleurX catheter placement  Approved with Dr Serafina Royals  Pt confused some today Unable to answer most questions correctly Discussed everything with Dtr Ila Mcgill via phone  Past Medical History:  Diagnosis Date  . Arthritis   . GERD (gastroesophageal reflux disease)   . Peripheral neuropathy   . Stool incontinence   . Urge incontinence   . Vertigo     Past Surgical History:  Procedure Laterality Date  . bletharoplaasty  2017  . BREAST LUMPECTOMY Right   . CATARACT EXTRACTION, BILATERAL  2016  . FACIAL COSMETIC SURGERY    . IR PARACENTESIS  01/31/2020  . NASAL SEPTUM SURGERY    . TONSILLECTOMY      Allergies: Sulfa antibiotics  Medications: Prior to Admission medications   Medication Sig Start Date End Date Taking? Authorizing Provider  bimatoprost (LATISSE) 0.03 % ophthalmic solution APPLY EXTERNALLY ALONG UPPER EYELID MARGIN AT BASE OF EYELASHES ONCE A DAY 05/15/19   [provider]  Cholecalciferol (VITAMIN D) 2000 units tablet Take 2,000 Units by mouth daily.    [provider]  gabapentin (NEURONTIN) 100 MG capsule Take 2 capsules by mouth. 1 tab in the am 2 in the pm. 09/22/16   [provider]  lansoprazole (PREVACID) 30 MG capsule Take 1 capsule by mouth daily. 02/04/16   [provider]   meloxicam (MOBIC) 7.5 MG tablet Take 1 tablet by mouth 2 (two) times daily. 10/01/16   [provider]  mirabegron ER (MYRBETRIQ) 25 MG TB24 tablet Take 1 tablet by mouth daily. 02/04/16   [provider]  mirtazapine (REMERON) 7.5 MG tablet Take 1 tablet by mouth at bedtime. 02/04/16   [provider]  Omega-3 Fatty Acids (FISH OIL) 1000 MG CAPS Take 1 capsule by mouth daily.    [provider]     Family History  Problem Relation Age of Onset  . Heart disease Mother   . Dementia Mother   . Heart failure Father   . Lupus Father   . Emphysema Father     Social History   Socioeconomic History  . Marital status: Single    Spouse name: Not on file  . Number of children: Not on file  . Years of education: Not on file  . Highest education level: Not on file  Occupational History  . Not on file  Tobacco Use  . Smoking status: Former Smoker    Packs/day: 1.00    Types: Cigarettes    Quit date: 11/09/1996    Years since quitting: 23.3  . Smokeless tobacco: Never Used  Substance and Sexual Activity  . Alcohol use: No  . Drug use: No  . Sexual activity: Not on file  Other Topics Concern  . Not on file  Social History Narrative  . Not on file   Social Determinants of Health   Financial Resource Strain: Not on file  Food Insecurity: Not on file  Transportation Needs: Not on file  Physical Activity: Not on file  Stress: Not on file  Social Connections: Not on file    Review of Systems: A 12 point ROS discussed and pertinent positives are indicated in the HPI above.  All other systems are negative.   Vital Signs: BP (!) 149/78   Pulse 99   Ht 5' 6.5" (1.689 m)   Wt 136 lb (61.7 kg)   SpO2 94%   BMI 21.62 kg/m   Physical Exam Vitals reviewed.  HENT:     Mouth/Throat:     Mouth: Mucous membranes are moist.  Cardiovascular:     Rate and Rhythm: Regular rhythm.     Heart sounds: Normal heart sounds.  Pulmonary:     Breath  sounds: Normal breath sounds.  Abdominal:     General: There is distension.     Tenderness: There is no abdominal tenderness.  Skin:    General: Skin is warm.  Neurological:     Comments: Confused Unable to answer DOB; place; time Spoke to Dtr [employer] via phone POA--- gives consent to procedure     Imaging: IR Paracentesis  Result Date: 01/31/2020 INDICATION: Patient with history of gastroesophageal cancer, peritoneal carcinomatosis, ascites. Request made for therapeutic paracentesis. EXAM: ULTRASOUND GUIDED THERAPEUTIC PARACENTESIS MEDICATIONS: 1% lidocaine to skin and subcutaneous tissue COMPLICATIONS: None immediate. PROCEDURE: Informed written consent was obtained from the patient after a discussion of the risks, benefits and alternatives to treatment. A timeout was performed prior to the initiation of the procedure. Initial ultrasound scanning demonstrates a large amount of ascites within the right lower abdominal quadrant. The right lower abdomen was prepped and draped in the usual sterile fashion. 1% lidocaine was used for local anesthesia. Following this, a 19 gauge, 7-cm, Yueh catheter was introduced. An ultrasound image was saved for documentation purposes. The paracentesis was performed. The catheter was removed and a dressing was applied. The patient tolerated the procedure well without immediate post procedural complication. FINDINGS: A total of approximately 6.4 liters of slightly hazy, yellow fluid was removed. IMPRESSION: Successful ultrasound-guided therapeutic paracentesis yielding 6.4 liters of peritoneal fluid. Read by: Rowe Robert, PA-C Electronically Signed   By: Jerilynn Mages.  Shick M.D.   On: 01/31/2020 15:07    Labs:  CBC: No results for input(s): WBC, HGB, HCT, PLT in the last 8760 hours.  COAGS: No results for input(s): INR, APTT in the last 8760 hours.  BMP: No results for input(s): NA, K, CL, CO2, GLUCOSE, BUN, CALCIUM, CREATININE, GFRNONAA, GFRAA in the last 8760  hours.  Invalid input(s): CMP  LIVER FUNCTION TESTS: No results for input(s): BILITOT, AST, ALT, ALKPHOS, PROT, ALBUMIN in the last 8760 hours.  TUMOR MARKERS: No results for input(s): AFPTM, CEA, CA199, CHROMGRNA in the last 8760 hours.  Assessment and Plan:  Esophageal cancer Recurrent ascites In Hospice care at home: [place] for Peritoneal Pleurx catheter placement per [facility] request Dtr Bennie Dallas is aware of procedure benefits and risks- including but not limited to: infection; bleeding; damage to surrounding structures Agreeable to proceed Consent signed and in chart   Thank you for this interesting consult.  I greatly enjoyed meeting Jodi Miller and look forward to participating in their care.  A copy of this report was sent to the requesting provider on this date.  Electronically Signed: Lavonia Drafts, PA-C 02/25/2020, 10:43 AM   I spent a total of    25 Minutes in  face to face in clinical consultation, greater than 50% of which was counseling/coordinating care for peritoneal pleurx catheter placement

## 2020-02-25 NOTE — Sedation Documentation (Signed)
02 d/c 

## 2020-02-25 NOTE — Discharge Instructions (Addendum)
Per Dr Earleen Newport: Jodi Miller daily, up to max of 1L until patient can only drain out 150 ml. If less than 150 ml for 3 consecutive drains every other day, call Interventional Radiology [phone]) for evaluation and possible removal  Ascites Drainage Catheter Placement, Care After The following information offers guidance on how to care for yourself after your procedure. Your health care provider may also give you more specific instructions. If you have problems or questions, contact your health care provider. What can I expect after the procedure? After the procedure, it is common to have:  Pain, soreness, and bruising near the area where the catheter was placed.  Drowsiness or trouble remembering things, if you were given a medicine to help you relax during the procedure (sedative). This may last for several hours while the medicine wears off. Follow these instructions at home: Insertion site care  Follow instructions from your health care provider about how to take care of your insertion site. Make sure you: ? Wash your hands with soap and water for at least 20 seconds before and after you change your bandage (dressing). If soap and water are not available, use hand sanitizer. ? Change your dressing as told by your health care provider. As needed. Keep clean, dry, intact ? Leave stitches (sutures), skin glue, or adhesive strips in place. These skin closures may need to stay in place for 2 weeks or longer. If adhesive strip edges start to loosen and curl up, you may trim the loose edges. Do not remove adhesive strips completely unless your health care provider tells you to do that.  Check your insertion area every day for signs of infection. Check for: ? Redness, swelling, or more pain. ? Fluid or blood. ? Warmth. ? Pus or a bad smell. Eating and drinking Follow any dietary instructions from your health care provider. You may need to:  Limit foods and liquids that increase or decrease how much  fluid your body holds onto (retains).  Limit how much salt (sodium) you eat.  Eat a high-protein diet. This includes foods like meat, fish, eggs, and beans.  Avoid alcohol. Activity  Rest as told by your health care provider.  Return to your normal activities as told by your health care provider. Ask your health care provider what activities are safe for you.  Avoid sitting for a long time without moving. Get up to take short walks every 1-2 hours. This is important to improve blood flow and breathing. Ask for help if you feel weak or unsteady.  If you were given a sedative during the procedure, it can affect you for several hours. Do not drive or operate machinery until your health care provider says that it is safe. General instructions  If you are asked to drain fluid at home, follow instructions from your health care provider about how to do that.  Weigh yourself every day. A change in weight may mean that too much fluid is building up in your body. Ask how much change in your weight would give you a reason to call or visit your health care provider.  Do not take baths, swim, or use a hot tub until your health care provider approves. Ask your health care provider if you may take showers. You may only be allowed to take sponge baths.  Take over-the-counter and prescription medicines only as told by your health care provider.  Keep all follow-up visits. This is important.   Contact a health care provider if:  You  have redness, swelling, or pain around the insertion area where the catheter was inserted.  You have fluid or blood coming from the insertion site.  Your insertion site feels warm to the touch.  You have pus or a bad smell coming from the insertion site.  You have trouble draining fluid.  You have trouble caring for your catheter.  You have pain, bloating, or cramping in your abdomen. Get help right away if:  You develop a fever or chills.  You have increasing  redness or increasing pain around the insertion site.  The fluid that drains out of the catheter turns cloudy or has a bad smell.  You have abdominal pain or cramping that gets worse or does not go away. Summary  You may have pain, soreness, and bruising near the area where the catheter was placed.  Weigh yourself every day. A change in weight may mean that too much fluid is building up in your body.  Follow instructions from your health care provider about how to care for the insertion area. This information is not intended to replace advice given to you by your health care provider. Make sure you discuss any questions you have with your health care provider. Document Revised: 09/18/2019 Document Reviewed: 09/18/2019 Elsevier Patient Education  2021 [employer].  Moderate Conscious Sedation, Adult, Care After This sheet gives you information about how to care for yourself after your procedure. Your health care provider may also give you more specific instructions. If you have problems or questions, contact your health care provider. What can I expect after the procedure? After the procedure, it is common to have:  Sleepiness for several hours.  Impaired judgment for several hours.  Difficulty with balance.  Vomiting if you eat too soon. Follow these instructions at home: For the time period you were told by your health care provider: 24 hours  Rest.  Do not participate in activities where you could fall or become injured.  Do not drive or use machinery.  Do not drink alcohol.  Do not take sleeping pills or medicines that cause drowsiness.  Do not make important decisions or sign legal documents.  Do not take care of children on your own.      Eating and drinking  Follow the diet recommended by your health care provider.  Drink enough fluid to keep your urine pale yellow.  If you vomit: ? Drink water, juice, or soup when you can drink without vomiting. ? Make  sure you have little or no nausea before eating solid foods.   General instructions  Take over-the-counter and prescription medicines only as told by your health care provider.  Have a responsible adult stay with you for the time you are told. It is important to have someone help care for you until you are awake and alert.  Do not smoke.  Keep all follow-up visits as told by your health care provider. This is important. Contact a health care provider if:  You are still sleepy or having trouble with balance after 24 hours.  You feel light-headed.  You keep feeling nauseous or you keep vomiting.  You develop a rash.  You have a fever.  You have redness or swelling around the IV site. Get help right away if:  You have trouble breathing.  You have new-onset confusion at home. Summary  After the procedure, it is common to feel sleepy, have impaired judgment, or feel nauseous if you eat too soon.  Rest after you  get home. Know the things you should not do after the procedure.  Follow the diet recommended by your health care provider and drink enough fluid to keep your urine pale yellow.  Get help right away if you have trouble breathing or new-onset confusion at home. This information is not intended to replace advice given to you by your health care provider. Make sure you discuss any questions you have with your health care provider. Document Revised: 05/04/2019 Document Reviewed: 11/30/2018 Elsevier Patient Education  2021 [employer].

## 2020-02-25 NOTE — Procedures (Signed)
Interventional Radiology Procedure Note  Procedure:   Image guided peritoneal tunneled drainage catheter.   Complications: None Recommendations:  - Ok to use - Do not submerge - Routine wound care - 1 hr dc home  Signed,  Dulcy Fanny. Earleen Newport, DO

## 2020-02-26 DIAGNOSIS — R1319 Other dysphagia: Secondary | ICD-10-CM | POA: Diagnosis not present

## 2020-02-26 DIAGNOSIS — G893 Neoplasm related pain (acute) (chronic): Secondary | ICD-10-CM | POA: Diagnosis not present

## 2020-02-26 DIAGNOSIS — C158 Malignant neoplasm of overlapping sites of esophagus: Secondary | ICD-10-CM | POA: Diagnosis not present

## 2020-02-26 DIAGNOSIS — C169 Malignant neoplasm of stomach, unspecified: Secondary | ICD-10-CM | POA: Diagnosis not present

## 2020-02-26 DIAGNOSIS — Z931 Gastrostomy status: Secondary | ICD-10-CM | POA: Diagnosis not present

## 2020-02-26 DIAGNOSIS — G629 Polyneuropathy, unspecified: Secondary | ICD-10-CM | POA: Diagnosis not present

## 2020-02-27 DIAGNOSIS — C169 Malignant neoplasm of stomach, unspecified: Secondary | ICD-10-CM | POA: Diagnosis not present

## 2020-02-27 DIAGNOSIS — R1319 Other dysphagia: Secondary | ICD-10-CM | POA: Diagnosis not present

## 2020-02-27 DIAGNOSIS — Z931 Gastrostomy status: Secondary | ICD-10-CM | POA: Diagnosis not present

## 2020-02-27 DIAGNOSIS — G629 Polyneuropathy, unspecified: Secondary | ICD-10-CM | POA: Diagnosis not present

## 2020-02-27 DIAGNOSIS — C158 Malignant neoplasm of overlapping sites of esophagus: Secondary | ICD-10-CM | POA: Diagnosis not present

## 2020-02-27 DIAGNOSIS — G893 Neoplasm related pain (acute) (chronic): Secondary | ICD-10-CM | POA: Diagnosis not present

## 2020-02-28 DIAGNOSIS — C169 Malignant neoplasm of stomach, unspecified: Secondary | ICD-10-CM | POA: Diagnosis not present

## 2020-02-28 DIAGNOSIS — R1319 Other dysphagia: Secondary | ICD-10-CM | POA: Diagnosis not present

## 2020-02-28 DIAGNOSIS — G893 Neoplasm related pain (acute) (chronic): Secondary | ICD-10-CM | POA: Diagnosis not present

## 2020-02-28 DIAGNOSIS — Z931 Gastrostomy status: Secondary | ICD-10-CM | POA: Diagnosis not present

## 2020-02-28 DIAGNOSIS — G629 Polyneuropathy, unspecified: Secondary | ICD-10-CM | POA: Diagnosis not present

## 2020-02-28 DIAGNOSIS — C158 Malignant neoplasm of overlapping sites of esophagus: Secondary | ICD-10-CM | POA: Diagnosis not present

## 2020-02-29 DIAGNOSIS — G629 Polyneuropathy, unspecified: Secondary | ICD-10-CM | POA: Diagnosis not present

## 2020-02-29 DIAGNOSIS — Z931 Gastrostomy status: Secondary | ICD-10-CM | POA: Diagnosis not present

## 2020-02-29 DIAGNOSIS — G893 Neoplasm related pain (acute) (chronic): Secondary | ICD-10-CM | POA: Diagnosis not present

## 2020-02-29 DIAGNOSIS — R1319 Other dysphagia: Secondary | ICD-10-CM | POA: Diagnosis not present

## 2020-02-29 DIAGNOSIS — C158 Malignant neoplasm of overlapping sites of esophagus: Secondary | ICD-10-CM | POA: Diagnosis not present

## 2020-02-29 DIAGNOSIS — C169 Malignant neoplasm of stomach, unspecified: Secondary | ICD-10-CM | POA: Diagnosis not present

## 2020-03-02 DIAGNOSIS — C169 Malignant neoplasm of stomach, unspecified: Secondary | ICD-10-CM | POA: Diagnosis not present

## 2020-03-02 DIAGNOSIS — Z931 Gastrostomy status: Secondary | ICD-10-CM | POA: Diagnosis not present

## 2020-03-02 DIAGNOSIS — G629 Polyneuropathy, unspecified: Secondary | ICD-10-CM | POA: Diagnosis not present

## 2020-03-02 DIAGNOSIS — R1319 Other dysphagia: Secondary | ICD-10-CM | POA: Diagnosis not present

## 2020-03-02 DIAGNOSIS — C158 Malignant neoplasm of overlapping sites of esophagus: Secondary | ICD-10-CM | POA: Diagnosis not present

## 2020-03-02 DIAGNOSIS — G893 Neoplasm related pain (acute) (chronic): Secondary | ICD-10-CM | POA: Diagnosis not present

## 2020-03-03 DIAGNOSIS — G629 Polyneuropathy, unspecified: Secondary | ICD-10-CM | POA: Diagnosis not present

## 2020-03-03 DIAGNOSIS — R1319 Other dysphagia: Secondary | ICD-10-CM | POA: Diagnosis not present

## 2020-03-03 DIAGNOSIS — Z931 Gastrostomy status: Secondary | ICD-10-CM | POA: Diagnosis not present

## 2020-03-03 DIAGNOSIS — C169 Malignant neoplasm of stomach, unspecified: Secondary | ICD-10-CM | POA: Diagnosis not present

## 2020-03-03 DIAGNOSIS — G893 Neoplasm related pain (acute) (chronic): Secondary | ICD-10-CM | POA: Diagnosis not present

## 2020-03-03 DIAGNOSIS — C158 Malignant neoplasm of overlapping sites of esophagus: Secondary | ICD-10-CM | POA: Diagnosis not present

## 2020-03-04 DIAGNOSIS — G893 Neoplasm related pain (acute) (chronic): Secondary | ICD-10-CM | POA: Diagnosis not present

## 2020-03-04 DIAGNOSIS — Z931 Gastrostomy status: Secondary | ICD-10-CM | POA: Diagnosis not present

## 2020-03-04 DIAGNOSIS — R1319 Other dysphagia: Secondary | ICD-10-CM | POA: Diagnosis not present

## 2020-03-04 DIAGNOSIS — C169 Malignant neoplasm of stomach, unspecified: Secondary | ICD-10-CM | POA: Diagnosis not present

## 2020-03-04 DIAGNOSIS — C158 Malignant neoplasm of overlapping sites of esophagus: Secondary | ICD-10-CM | POA: Diagnosis not present

## 2020-03-04 DIAGNOSIS — G629 Polyneuropathy, unspecified: Secondary | ICD-10-CM | POA: Diagnosis not present

## 2020-03-05 DIAGNOSIS — G629 Polyneuropathy, unspecified: Secondary | ICD-10-CM | POA: Diagnosis not present

## 2020-03-05 DIAGNOSIS — G893 Neoplasm related pain (acute) (chronic): Secondary | ICD-10-CM | POA: Diagnosis not present

## 2020-03-05 DIAGNOSIS — Z931 Gastrostomy status: Secondary | ICD-10-CM | POA: Diagnosis not present

## 2020-03-05 DIAGNOSIS — C158 Malignant neoplasm of overlapping sites of esophagus: Secondary | ICD-10-CM | POA: Diagnosis not present

## 2020-03-05 DIAGNOSIS — C169 Malignant neoplasm of stomach, unspecified: Secondary | ICD-10-CM | POA: Diagnosis not present

## 2020-03-05 DIAGNOSIS — R1319 Other dysphagia: Secondary | ICD-10-CM | POA: Diagnosis not present

## 2020-03-06 DIAGNOSIS — G629 Polyneuropathy, unspecified: Secondary | ICD-10-CM | POA: Diagnosis not present

## 2020-03-06 DIAGNOSIS — C169 Malignant neoplasm of stomach, unspecified: Secondary | ICD-10-CM | POA: Diagnosis not present

## 2020-03-06 DIAGNOSIS — R1319 Other dysphagia: Secondary | ICD-10-CM | POA: Diagnosis not present

## 2020-03-06 DIAGNOSIS — C158 Malignant neoplasm of overlapping sites of esophagus: Secondary | ICD-10-CM | POA: Diagnosis not present

## 2020-03-06 DIAGNOSIS — Z931 Gastrostomy status: Secondary | ICD-10-CM | POA: Diagnosis not present

## 2020-03-06 DIAGNOSIS — G893 Neoplasm related pain (acute) (chronic): Secondary | ICD-10-CM | POA: Diagnosis not present

## 2020-03-07 DIAGNOSIS — G629 Polyneuropathy, unspecified: Secondary | ICD-10-CM | POA: Diagnosis not present

## 2020-03-07 DIAGNOSIS — C169 Malignant neoplasm of stomach, unspecified: Secondary | ICD-10-CM | POA: Diagnosis not present

## 2020-03-07 DIAGNOSIS — C158 Malignant neoplasm of overlapping sites of esophagus: Secondary | ICD-10-CM | POA: Diagnosis not present

## 2020-03-07 DIAGNOSIS — G893 Neoplasm related pain (acute) (chronic): Secondary | ICD-10-CM | POA: Diagnosis not present

## 2020-03-07 DIAGNOSIS — R1319 Other dysphagia: Secondary | ICD-10-CM | POA: Diagnosis not present

## 2020-03-07 DIAGNOSIS — Z931 Gastrostomy status: Secondary | ICD-10-CM | POA: Diagnosis not present

## 2020-03-08 DIAGNOSIS — Z931 Gastrostomy status: Secondary | ICD-10-CM | POA: Diagnosis not present

## 2020-03-08 DIAGNOSIS — C169 Malignant neoplasm of stomach, unspecified: Secondary | ICD-10-CM | POA: Diagnosis not present

## 2020-03-08 DIAGNOSIS — R1319 Other dysphagia: Secondary | ICD-10-CM | POA: Diagnosis not present

## 2020-03-08 DIAGNOSIS — G629 Polyneuropathy, unspecified: Secondary | ICD-10-CM | POA: Diagnosis not present

## 2020-03-08 DIAGNOSIS — C158 Malignant neoplasm of overlapping sites of esophagus: Secondary | ICD-10-CM | POA: Diagnosis not present

## 2020-03-08 DIAGNOSIS — G893 Neoplasm related pain (acute) (chronic): Secondary | ICD-10-CM | POA: Diagnosis not present

## 2020-03-18 DEATH — deceased

## 2020-03-26 ENCOUNTER — Ambulatory Visit: Payer: Medicare Other | Admitting: Podiatry

## 2022-02-23 IMAGING — US IR ABSCESS DRAINAGE
1 series · 1 of 1 positions shown · non-contrast
Comparison: none

INDICATION: 85-year-old female referred for tunneled peritoneal catheter

[Series 1: ir abscess drainage · 1 of 1 slices shown]
[im 1/1]
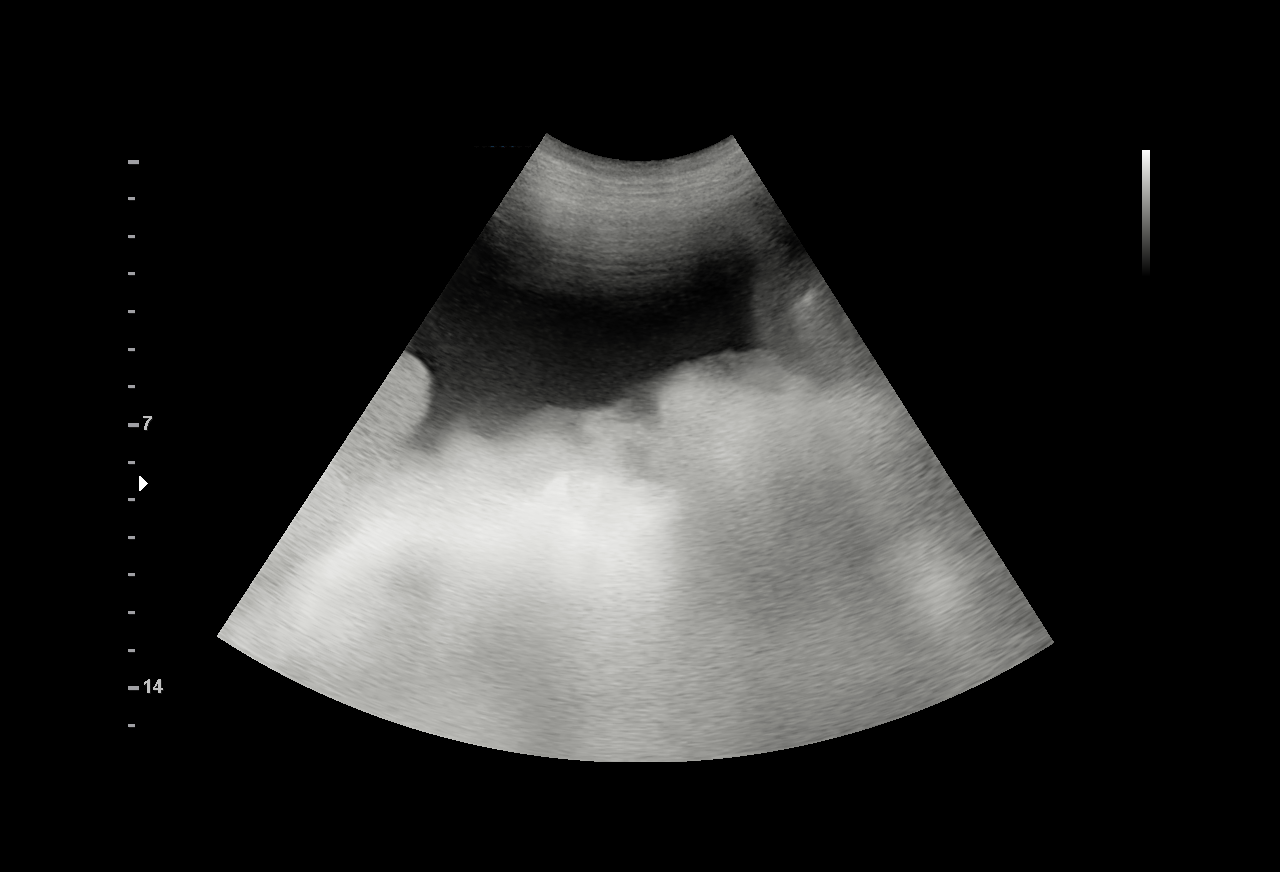

[1 of 1 positions shown; findings below may reference images not displayed]

EXAM:
IMAGE GUIDED PLACEMENT OF TUNNELED PERITONEAL CATHETER

MEDICATIONS:
2 g IV Ancef the antibiotics were administered within an appropriate
time frame prior to the initiation of the procedure.

ANESTHESIA/SEDATION:
Fentanyl 25 mcg IV; Versed 0.5 mg IV

Moderate Sedation Time:  10 minutes

The patient was continuously monitored during the procedure by the
interventional radiology nurse under my direct supervision.

COMPLICATIONS:
None

PROCEDURE:
Informed written consent was obtained from the patient after a
thorough discussion of the procedural risks, benefits and
alternatives. All questions were addressed. Maximal Sterile Barrier
Technique was utilized including caps, mask, sterile gowns, sterile
gloves, sterile drape, hand hygiene and skin antiseptic. A timeout
was performed prior to the initiation of the procedure.

The right abdominal wall was prepped with chlorhexidine in a sterile
fashion, and a sterile drape was applied covering the operative
field. A sterile gown and sterile gloves were used for the
procedure. Local anesthesia was provided with 1% Lidocaine.
Ultrasound image documentation was performed.

After creating a small skin incision, a 19 gauge needle was advanced
into the peritoneal cavity under ultrasound guidance. A guide wire
was then advanced under fluoroscopy into the space. Access was
dilated serially and a 16-French peel-away sheath placed.

The skin and subcutaneous tissues were generously infiltrated with
1% lidocaine from the puncture site along the abdomen anteriorly. A
small stab incision was made with 11 blade scalpel at the insertion
site of the catheter, and the catheter was back tunneled to the site
at the puncture.

A tunneled cuffed peritoneal catheter was placed. This was tunneled
from the incision 5 cm superior to the access to the access site.
The catheter was advanced through the peel-away sheath. The sheath
was then removed. Final catheter positioning was confirmed with a
fluoroscopic spot image.

The access incision was closed with Dermabond. Dermabond was applied
to the catheterization incision. Large volume paracentesis was
performed through the new catheter utilizing vacuum containers.

The patient tolerated the procedure well and remained
hemodynamically stable throughout.

No complications were encountered and no significant blood loss was
encountered.
IMPRESSION: Status post tunneled peritoneal drainage catheter.
